# Patient Record
Sex: Male | Born: 1980 | Race: White | Hispanic: No | Marital: Married | Smoking: Current every day smoker
Health system: Southern US, Community
[De-identification: ages and names within clinical notes are randomized; demographics above are authoritative.]

## PROBLEM LIST (undated history)

## (undated) DIAGNOSIS — Z9889 Other specified postprocedural states: Secondary | ICD-10-CM

## (undated) DIAGNOSIS — I4891 Unspecified atrial fibrillation: Secondary | ICD-10-CM

## (undated) DIAGNOSIS — I219 Acute myocardial infarction, unspecified: Secondary | ICD-10-CM

## (undated) HISTORY — PX: LOOP RECORDER REMOVAL: EP1215

## (undated) HISTORY — PX: LOOP RECORDER IMPLANT: SHX5954

---

## 2001-11-03 ENCOUNTER — Emergency Department (HOSPITAL_COMMUNITY): Admission: EM | Admit: 2001-11-03 | Discharge: 2001-11-03 | Payer: Self-pay | Admitting: Emergency Medicine

## 2001-11-03 ENCOUNTER — Encounter: Payer: Self-pay | Admitting: Emergency Medicine

## 2003-02-07 ENCOUNTER — Encounter: Payer: Self-pay | Admitting: Orthopedic Surgery

## 2003-02-07 ENCOUNTER — Ambulatory Visit (HOSPITAL_COMMUNITY): Admission: RE | Admit: 2003-02-07 | Discharge: 2003-02-07 | Payer: Self-pay | Admitting: Orthopedic Surgery

## 2003-03-01 ENCOUNTER — Encounter: Payer: Self-pay | Admitting: Orthopedic Surgery

## 2003-03-01 ENCOUNTER — Ambulatory Visit (HOSPITAL_COMMUNITY): Admission: RE | Admit: 2003-03-01 | Discharge: 2003-03-01 | Payer: Self-pay | Admitting: Orthopedic Surgery

## 2007-05-13 ENCOUNTER — Ambulatory Visit: Payer: Self-pay | Admitting: Psychiatry

## 2007-05-13 ENCOUNTER — Inpatient Hospital Stay (HOSPITAL_COMMUNITY): Admission: AD | Admit: 2007-05-13 | Discharge: 2007-05-16 | Payer: Self-pay | Admitting: Psychiatry

## 2010-11-20 ENCOUNTER — Emergency Department (HOSPITAL_BASED_OUTPATIENT_CLINIC_OR_DEPARTMENT_OTHER)
Admission: EM | Admit: 2010-11-20 | Discharge: 2010-11-20 | Payer: Self-pay | Source: Home / Self Care | Admitting: Emergency Medicine

## 2010-11-24 LAB — URINALYSIS, ROUTINE W REFLEX MICROSCOPIC
Bilirubin Urine: NEGATIVE
Hgb urine dipstick: NEGATIVE
Ketones, ur: NEGATIVE mg/dL
Nitrite: NEGATIVE
Protein, ur: NEGATIVE mg/dL
Specific Gravity, Urine: 1.007 (ref 1.005–1.030)
Urine Glucose, Fasting: NEGATIVE mg/dL
Urobilinogen, UA: 0.2 mg/dL (ref 0.0–1.0)
pH: 5.5 (ref 5.0–8.0)

## 2010-11-24 LAB — DIFFERENTIAL
Basophils Absolute: 0 10*3/uL (ref 0.0–0.1)
Basophils Relative: 1 % (ref 0–1)
Eosinophils Absolute: 0.1 10*3/uL (ref 0.0–0.7)
Eosinophils Relative: 1 % (ref 0–5)
Lymphocytes Relative: 39 % (ref 12–46)
Lymphs Abs: 2.2 10*3/uL (ref 0.7–4.0)
Monocytes Absolute: 0.6 10*3/uL (ref 0.1–1.0)
Monocytes Relative: 10 % (ref 3–12)
Neutro Abs: 2.8 10*3/uL (ref 1.7–7.7)
Neutrophils Relative %: 49 % (ref 43–77)

## 2010-11-24 LAB — BASIC METABOLIC PANEL
BUN: 18 mg/dL (ref 6–23)
CO2: 27 mEq/L (ref 19–32)
Calcium: 9.6 mg/dL (ref 8.4–10.5)
Chloride: 104 mEq/L (ref 96–112)
Creatinine, Ser: 0.8 mg/dL (ref 0.4–1.5)
GFR calc Af Amer: >60 mL/min (ref >60)
GFR calc non Af Amer: >60 mL/min (ref >60)
Glucose, Bld: 101 mg/dL — ABNORMAL HIGH (ref 70–99)
Potassium: 4.5 mEq/L (ref 3.5–5.1)
Sodium: 140 mEq/L (ref 135–145)

## 2010-11-24 LAB — CBC
HCT: 42.9 % (ref 39.0–52.0)
Hemoglobin: 15.3 g/dL (ref 13.0–17.0)
MCH: 29.4 pg (ref 26.0–34.0)
MCHC: 35.7 g/dL (ref 30.0–36.0)
MCV: 82.5 fL (ref 78.0–100.0)
Platelets: 251 10*3/uL (ref 150–400)
RBC: 5.2 MIL/uL (ref 4.22–5.81)
RDW: 12.2 % (ref 11.5–15.5)
WBC: 5.7 10*3/uL (ref 4.0–10.5)

## 2011-02-04 ENCOUNTER — Emergency Department (HOSPITAL_BASED_OUTPATIENT_CLINIC_OR_DEPARTMENT_OTHER)
Admission: EM | Admit: 2011-02-04 | Discharge: 2011-02-04 | Disposition: A | Discharge status: Home / Self Care | Payer: Self-pay | Attending: Emergency Medicine | Admitting: Emergency Medicine

## 2011-02-04 DIAGNOSIS — L03317 Cellulitis of buttock: Secondary | ICD-10-CM | POA: Insufficient documentation

## 2011-02-04 DIAGNOSIS — L0231 Cutaneous abscess of buttock: Secondary | ICD-10-CM | POA: Insufficient documentation

## 2011-02-05 ENCOUNTER — Emergency Department (HOSPITAL_BASED_OUTPATIENT_CLINIC_OR_DEPARTMENT_OTHER)
Admission: EM | Admit: 2011-02-05 | Discharge: 2011-02-05 | Disposition: A | Discharge status: Home / Self Care | Payer: Self-pay | Attending: Emergency Medicine | Admitting: Emergency Medicine

## 2011-02-05 DIAGNOSIS — L0231 Cutaneous abscess of buttock: Secondary | ICD-10-CM | POA: Insufficient documentation

## 2011-02-05 DIAGNOSIS — Z4800 Encounter for change or removal of nonsurgical wound dressing: Secondary | ICD-10-CM | POA: Insufficient documentation

## 2011-02-05 DIAGNOSIS — F17200 Nicotine dependence, unspecified, uncomplicated: Secondary | ICD-10-CM | POA: Insufficient documentation

## 2011-03-20 NOTE — Discharge Summary (Signed)
NAMESTRUMMER, CANIPE                ACCOUNT NO.:  [id number]   MEDICAL RECORD NO.:  [id number]          PATIENT TYPE:  IPS   LOCATION:  0502                          FACILITY:  [facility]   PHYSICIAN:  Jasmine Pang, M.D. DATE OF BIRTH:  1980-12-03   DATE OF ADMISSION:  05/13/2007  DATE OF DISCHARGE:  05/16/2007                               DISCHARGE SUMMARY   IDENTIFICATION:  This is a 30 year old divorced white male who was at  admitted on an involuntary basis on May 13, 2007.   HISTORY OF PRESENT ILLNESS:  The patient states he started drinking  alcohol, he began to drink up to a fifth of vodka, after getting into an  argument over the phone with his ex-wife.  He was upset after receiving  papers charging him with phone harassment.  He states he was just trying  to call is 26-year-old son.  He has visitation with his son every week.  He currently has a court date due to phone harassment.  In addition, his  ex-girlfriend reconciled with her husband.  He has been diagnosed with  bipolar disorder and treated with Seroquel.  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  There are no medical problems.  He is on no medications.  He has no known drug allergies.   PHYSICAL FINDINGS:  The patient was evaluated in the ED prior to  admission.  He had no acute medical or physical problems.   Admission laboratories were done in the ED prior to admission and  evaluated by the ED physician.   HOSPITAL COURSE:  Upon admission, the patient was started on a 21 mg  nicotine patch for smoking cessation protocol.  He was also started on  trazodone 50 mg orally nightly p.r.n., may repeat x1.  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  He was friendly and cooperative.  He discussed stressors  before admission.  He  states he felt his ex-wife was keeping his son  away from him.  He admits to drinking Thursday and then going to  [facility].  They sent him here after he made a threat to hurt  his ex-wife.  As hospitalization progressed, his mood and affect  improved.  A counseling session was held with the counselor and the  patient and his sister about the discharge plan.  The patient's sister  was going to be a good support for the patient.  She will be there if  the patient has an urge to drink or make contact with the patient's ex-  girlfriend.  The patient and sister also came up with a plan B if his  initial plan does not work.  The patient's sister will call the  patient's mother if things get out of hand.  On May 16, 2007, his  mental status had improved.  The patient's mood was euthymic.  Affect,  wide range.  He had no suicidal or homicidal ideation.  No thoughts of  self-injurious behavior.  No auditory or visual hallucinations.  No  paranoia or delusions.  Thoughts were logical and goal-directed.  Thought content:  No predominant theme.  Cognitive was grossly back to  baseline.  It was felt that the patient was safe to be discharged home  with his sister.  He was advised that we would have to honor the duty to  warn law and call his ex-wife since he had made threats against her  which led to his hospitalization.  He was unhappy with this but  understood the necessity of it.  Our case manager did contact his ex-  wife to alert her that these threats had been made.  She was informed  that he was no longer making these threats, but that we wanted to make  her aware of the statements.  She planned to have another restraining  order issued for the patient.   DISCHARGE DIAGNOSES:  AXIS I:  Mood disorder NOS.  Alcohol abuse.  AXIS II:  None.  AXIS III:  None.  AXIS IV:  Severe (Marital and family stress, occupational problem, and  other psychosocial problems.)  AXIS V:  Global  Assessment of Functioning upon discharge was 50.  Global  Assessment of Functioning upon admission was 38.  Global Assessment of  Functioning highest past year was 65.   DISCHARGE/PLAN:  The patient had no specific activity level or dietary  restrictions.   POST HOSPITAL CARE PLANS:  The patient will be seen at the [facility] for follow-up psychiatric treatment.  The patient has no  meds prescribed here.  He was afraid that this would jeopardize his  ability to work at the [employer].      Jasmine Pang, M.D.  Electronically Signed     [facility]/MEDQ  D:  05/26/2007  T:  05/27/2007  Job:  [id number]

## 2012-08-01 ENCOUNTER — Encounter (HOSPITAL_BASED_OUTPATIENT_CLINIC_OR_DEPARTMENT_OTHER): Payer: Self-pay | Admitting: Family Medicine

## 2012-08-01 ENCOUNTER — Emergency Department (HOSPITAL_BASED_OUTPATIENT_CLINIC_OR_DEPARTMENT_OTHER)
Admission: EM | Admit: 2012-08-01 | Discharge: 2012-08-01 | Disposition: A | Discharge status: Home / Self Care | Payer: Worker's Compensation | Attending: Emergency Medicine | Admitting: Emergency Medicine

## 2012-08-01 ENCOUNTER — Emergency Department (HOSPITAL_BASED_OUTPATIENT_CLINIC_OR_DEPARTMENT_OTHER): Payer: Worker's Compensation

## 2012-08-01 DIAGNOSIS — F17200 Nicotine dependence, unspecified, uncomplicated: Secondary | ICD-10-CM | POA: Insufficient documentation

## 2012-08-01 DIAGNOSIS — Y9389 Activity, other specified: Secondary | ICD-10-CM | POA: Insufficient documentation

## 2012-08-01 DIAGNOSIS — M25569 Pain in unspecified knee: Secondary | ICD-10-CM | POA: Insufficient documentation

## 2012-08-01 DIAGNOSIS — Y998 Other external cause status: Secondary | ICD-10-CM | POA: Insufficient documentation

## 2012-08-01 DIAGNOSIS — IMO0002 Reserved for concepts with insufficient information to code with codable children: Secondary | ICD-10-CM | POA: Insufficient documentation

## 2012-08-01 DIAGNOSIS — S93609A Unspecified sprain of unspecified foot, initial encounter: Secondary | ICD-10-CM

## 2012-08-01 DIAGNOSIS — M549 Dorsalgia, unspecified: Secondary | ICD-10-CM

## 2012-08-01 MED ORDER — MELOXICAM 15 MG PO TABS: 15.0000 mg | ORAL_TABLET | Freq: Every day | ORAL | Status: DC

## 2012-08-01 NOTE — ED Notes (Signed)
Pt sts right ankle injury occurred last Thursday out of town and had neg XR and told it was a "bad sprain". Pt sts swelling and pain worse. Pt also c/o low back pain with neg lumbar XR. Pt ambulating on crutches, air splint in place.

## 2012-08-01 NOTE — ED Provider Notes (Signed)
Medical screening examination/treatment/procedure(s) were performed by non-physician practitioner and as supervising physician I was immediately available for consultation/collaboration.   Mahari Vankirk, MD 08/01/12 1728 

## 2012-08-01 NOTE — ED Notes (Signed)
Pt c/o of right ankle pain along with knee and lower back pain from fall on Thursday. Pt states he cannot move his toes and its hurts when trying to move ankle. Able to move knee but complains of pain on the outer aspect of knee.

## 2012-08-01 NOTE — ED Provider Notes (Signed)
History     CSN: [id number]  Arrival date & time 08/01/12  1233   First MD Initiated Contact with Patient 08/01/12 1354      Chief Complaint  Patient presents with  . Ankle Injury    (Consider location/radiation/quality/duration/timing/severity/associated sxs/prior treatment) Patient is a 31 y.o. male presenting with fall. The history is provided by the patient. No language interpreter was used.  Fall Incident onset: 5 days ago. Incident: while lifting heavy furniture. He landed on a hard floor. There was no blood loss. The point of impact was the left knee (left foot). The pain is present in the left knee (left ankle). The pain is at a severity of 6/10. The pain is moderate. He was not ambulatory at the scene. There was no entrapment after the fall. The symptoms are aggravated by standing and ambulation. He has tried ice for the symptoms. The treatment provided no relief.  Pt reports he fell and hit back,  Injured foot and knee.   Pt reports he has back and foot xrays in [place].   Pt reports back is sore but he is continuing to have a lot of pain in his ankle and foot.   History reviewed. No pertinent past medical history.  History reviewed. No pertinent past surgical history.  No family history on file.  History  Substance Use Topics  . Smoking status: Current Every Day Smoker  . Smokeless tobacco: Not on file  . Alcohol Use: No      Review of Systems  Musculoskeletal: Positive for back pain and joint swelling.  All other systems reviewed and are negative.    Allergies  Aspirin  Home Medications  No current outpatient prescriptions on file.  BP 126/80  Pulse 87  Temp 98 F (36.7 C) (Oral)  Resp 16  Ht 6\' 2"  (1.88 m)  Wt 225 lb (102.059 kg)  BMI 28.89 kg/m2  SpO2 99%  Physical Exam  Nursing note and vitals reviewed. Constitutional: He is oriented to person, place, and time. He appears well-developed and well-nourished.  HENT:  Head: Normocephalic.    Cardiovascular: Normal rate.   Pulmonary/Chest: Effort normal.  Musculoskeletal: He exhibits tenderness.       Tender left knee,  No swelling,  Left  Ankle no swelling,  Foot tender no eccymosis.  Neurological: He is alert and oriented to person, place, and time. He has normal reflexes.  Skin: Skin is warm.    ED Course  Procedures (including critical care time)  Labs Reviewed - No data to display No results found.   1. Foot sprain   2. Knee pain   3. Back pain       MDM  Pt given rx for mobic,   I advised follow up with Dr. Shon Baton or your workers comp Physicain for recheck in 3-4 days  Mobic     Lonia Skinner [place], [place] 08/01/12 1518  Lonia Skinner [place], [place] 08/01/12 1521

## 2012-08-23 DIAGNOSIS — S62319A Displaced fracture of base of unspecified metacarpal bone, initial encounter for closed fracture: Secondary | ICD-10-CM | POA: Insufficient documentation

## 2012-08-23 DIAGNOSIS — M79643 Pain in unspecified hand: Secondary | ICD-10-CM | POA: Insufficient documentation

## 2012-08-23 DIAGNOSIS — M25579 Pain in unspecified ankle and joints of unspecified foot: Secondary | ICD-10-CM | POA: Insufficient documentation

## 2012-09-08 DIAGNOSIS — S92213A Displaced fracture of cuboid bone of unspecified foot, initial encounter for closed fracture: Secondary | ICD-10-CM | POA: Insufficient documentation

## 2013-04-19 ENCOUNTER — Emergency Department (HOSPITAL_COMMUNITY)
Admission: EM | Admit: 2013-04-19 | Discharge: 2013-04-19 | Disposition: A | Discharge status: Home / Self Care | Payer: Self-pay | Attending: Emergency Medicine | Admitting: Emergency Medicine

## 2013-04-19 ENCOUNTER — Encounter (HOSPITAL_COMMUNITY): Payer: Self-pay | Admitting: Adult Health

## 2013-04-19 ENCOUNTER — Emergency Department (HOSPITAL_COMMUNITY): Payer: Self-pay

## 2013-04-19 DIAGNOSIS — R109 Unspecified abdominal pain: Secondary | ICD-10-CM | POA: Insufficient documentation

## 2013-04-19 DIAGNOSIS — K921 Melena: Secondary | ICD-10-CM | POA: Insufficient documentation

## 2013-04-19 DIAGNOSIS — Z79899 Other long term (current) drug therapy: Secondary | ICD-10-CM | POA: Insufficient documentation

## 2013-04-19 DIAGNOSIS — R5381 Other malaise: Secondary | ICD-10-CM | POA: Insufficient documentation

## 2013-04-19 HISTORY — DX: Other specified postprocedural states: Z98.890

## 2013-04-19 LAB — COMPREHENSIVE METABOLIC PANEL
ALT: 25 U/L (ref 0–53)
AST: 20 U/L (ref 0–37)
Albumin: 4.3 g/dL (ref 3.5–5.2)
Alkaline Phosphatase: 79 U/L (ref 39–117)
BUN: 14 mg/dL (ref 6–23)
CO2: 27 mEq/L (ref 19–32)
Calcium: 9.8 mg/dL (ref 8.4–10.5)
Chloride: 101 mEq/L (ref 96–112)
Creatinine, Ser: 0.9 mg/dL (ref 0.50–1.35)
GFR calc Af Amer: >90 mL/min (ref >90)
GFR calc non Af Amer: >90 mL/min (ref >90)
Glucose, Bld: 100 mg/dL — ABNORMAL HIGH (ref 70–99)
Potassium: 4.4 mEq/L (ref 3.5–5.1)
Sodium: 139 mEq/L (ref 135–145)
Total Bilirubin: 0.3 mg/dL (ref 0.3–1.2)
Total Protein: 7.2 g/dL (ref 6.0–8.3)

## 2013-04-19 LAB — URINALYSIS, ROUTINE W REFLEX MICROSCOPIC
Bilirubin Urine: NEGATIVE
Glucose, UA: NEGATIVE mg/dL
Hgb urine dipstick: NEGATIVE
Ketones, ur: NEGATIVE mg/dL
Leukocytes, UA: NEGATIVE
Nitrite: NEGATIVE
Protein, ur: NEGATIVE mg/dL
Specific Gravity, Urine: 1.017 (ref 1.005–1.030)
Urobilinogen, UA: 1 mg/dL (ref 0.0–1.0)
pH: 6.5 (ref 5.0–8.0)

## 2013-04-19 LAB — CBC
HCT: 44.8 % (ref 39.0–52.0)
Hemoglobin: 16.4 g/dL (ref 13.0–17.0)
MCH: 30.8 pg (ref 26.0–34.0)
MCHC: 36.6 g/dL — ABNORMAL HIGH (ref 30.0–36.0)
MCV: 84.2 fL (ref 78.0–100.0)
Platelets: 281 10*3/uL (ref 150–400)
RBC: 5.32 MIL/uL (ref 4.22–5.81)
RDW: 12.4 % (ref 11.5–15.5)
WBC: 7.8 10*3/uL (ref 4.0–10.5)

## 2013-04-19 LAB — POCT I-STAT TROPONIN I: Troponin i, poc: 0.03 ng/mL (ref 0.00–0.08)

## 2013-04-19 LAB — LIPASE, BLOOD: Lipase: 29 U/L (ref 11–59)

## 2013-04-19 MED ORDER — SODIUM CHLORIDE 0.9 % IV BOLUS (SEPSIS)
500.0000 mL | Freq: Once | INTRAVENOUS | Status: AC
  Administered 2013-04-19: 500 mL via INTRAVENOUS

## 2013-04-19 MED ORDER — IOHEXOL 300 MG/ML  SOLN
100.0000 mL | Freq: Once | INTRAMUSCULAR | Status: AC | PRN
  Administered 2013-04-19: 100 mL via INTRAVENOUS

## 2013-04-19 MED ORDER — ONDANSETRON HCL 4 MG/2ML IJ SOLN
4.0000 mg | Freq: Once | INTRAMUSCULAR | Status: AC
  Administered 2013-04-19: 4 mg via INTRAVENOUS
  Filled 2013-04-19: qty 2

## 2013-04-19 MED ORDER — IOHEXOL 300 MG/ML  SOLN
25.0000 mL | INTRAMUSCULAR | Status: DC
  Administered 2013-04-19: 25 mL via ORAL

## 2013-04-19 MED ORDER — ONDANSETRON 8 MG PO TBDP: 8.0000 mg | ORAL_TABLET | Freq: Three times a day (TID) | ORAL | Status: DC | PRN

## 2013-04-19 MED ORDER — ONDANSETRON 4 MG PO TBDP
8.0000 mg | ORAL_TABLET | Freq: Once | ORAL | Status: AC
  Administered 2013-04-19: 8 mg via ORAL
  Filled 2013-04-19: qty 2

## 2013-04-19 NOTE — ED Provider Notes (Signed)
History     CSN: [id number]  Arrival date & time 04/19/13  1650   First MD Initiated Contact with Patient 04/19/13 1857      Chief Complaint  Patient presents with  . Chest Pain    (Consider location/radiation/quality/duration/timing/severity/associated sxs/prior treatment) Patient is a 32 y.o. male presenting with chest pain.  Chest Pain Associated symptoms: fatigue   Associated symptoms: no abdominal pain, no back pain, no headache, no nausea, no numbness, no shortness of breath, not vomiting and no weakness    patient has had chest and abdominal pain over the last few weeks. His been seen [facility] at [facility]   he has had a head CT. He reported had a syncopal episode a few weeks ago. He states he has had upper abdominal pain also. He states he does be with chest pain and abdominal pain. He states abdominal pain is on the right side and goes down into his groin. He states that his stool has been white and chalky recently. He states his energy level has been down also. He has not gained or lost weight. He states he also has had some blood in the stool times. The chest pain will come and go it is dull and retrosternal. Abdominal pain also, go it is dull. He states he feels better his legs are drawn up to his abdomen. No dysuria. Past Medical History  Diagnosis Date  . H/O eye surgery     History reviewed. No pertinent past surgical history.  History reviewed. No pertinent family history.  History  Substance Use Topics  . Smoking status: Current Every Day Smoker  . Smokeless tobacco: Not on file  . Alcohol Use: No      Review of Systems  Constitutional: Positive for fatigue. Negative for activity change and appetite change.  HENT: Negative for neck stiffness.   Eyes: Negative for pain.  Respiratory: Negative for chest tightness and shortness of breath.   Cardiovascular: Positive for chest pain. Negative for leg swelling.  Gastrointestinal: Positive for blood in stool.  Negative for nausea, vomiting, abdominal pain and diarrhea.  Genitourinary: Negative for flank pain.  Musculoskeletal: Negative for back pain.  Skin: Negative for rash.  Neurological: Negative for weakness, numbness and headaches.  Psychiatric/Behavioral: Negative for behavioral problems.    Allergies  Aspirin  Home Medications   Current Outpatient Rx  Name  Route  Sig  Dispense  Refill  . meloxicam (MOBIC) 15 MG tablet   Oral   Take 1 tablet (15 mg total) by mouth daily.   20 tablet   0   . oxyCODONE-acetaminophen (PERCOCET/ROXICET) 5-325 MG per tablet   Oral   Take 1 tablet by mouth every 4 (four) hours as needed for pain.         . promethazine (PHENERGAN) 25 MG tablet   Oral   Take 25 mg by mouth every 6 (six) hours as needed for nausea.         . ondansetron (ZOFRAN-ODT) 8 MG disintegrating tablet   Oral   Take 1 tablet (8 mg total) by mouth every 8 (eight) hours as needed for nausea.   10 tablet   0     BP 108/73  Pulse 53  Temp(Src) 97.3 F (36.3 C) (Oral)  Resp 20  SpO2 99%  Physical Exam  Nursing note and vitals reviewed. Constitutional: He is oriented to person, place, and time. He appears well-developed and well-nourished.  HENT:  Head: Normocephalic and atraumatic.  Eyes: EOM  are normal. Pupils are equal, round, and reactive to light.  Neck: Normal range of motion. Neck supple.  Cardiovascular: Normal rate, regular rhythm and normal heart sounds.   No murmur heard. Pulmonary/Chest: Effort normal and breath sounds normal.  Abdominal: Soft. Bowel sounds are normal. He exhibits no distension and no mass. There is tenderness. There is no rebound and no guarding.  Right-sided mid to lower abdominal tenderness. Also right upper quadrant tenderness. No hernias palpated. Mild right testicular tenderness. Normal lie.  Genitourinary: Penis normal.  Musculoskeletal: Normal range of motion. He exhibits no edema.  Neurological: He is alert and oriented to  person, place, and time. No cranial nerve deficit.  Skin: Skin is warm and dry.  Psychiatric: He has a normal mood and affect.    ED Course  Procedures (including critical care time)  Labs Reviewed  CBC - Abnormal; Notable for the following:    MCHC 36.6 (*)    All other components within normal limits  COMPREHENSIVE METABOLIC PANEL - Abnormal; Notable for the following:    Glucose, Bld 100 (*)    All other components within normal limits  LIPASE, BLOOD  URINALYSIS, ROUTINE W REFLEX MICROSCOPIC  POCT I-STAT TROPONIN I   Dg Chest 2 View  04/19/2013   *RADIOLOGY REPORT*  Clinical Data: 32 year old male chest pain shortness of breath and vomiting.  CHEST - 2 VIEW  Comparison: [facility] 04/17/2013 and earlier.  Findings: Lung volumes remain within normal limits. Normal cardiac size and mediastinal contours.  Visualized tracheal air column is within normal limits.  The lungs remain clear.  No pneumothorax or pneumoperitoneum. Visualized bowel gas pattern is nonobstructed. No acute osseous abnormality identified.  IMPRESSION: Negative, no acute cardiopulmonary abnormality.   Original Report Authenticated By: Erskine Speed, M.D.   Ct Abdomen Pelvis W Contrast  04/19/2013   *RADIOLOGY REPORT*  Clinical Data: Abdominal pain.  CT ABDOMEN AND PELVIS WITH CONTRAST  Technique:  Multidetector CT imaging of the abdomen and pelvis was performed following the standard protocol during bolus administration of intravenous contrast.  Contrast: OMNIPAQUE IOHEXOL 300 MG/ML  SOLN  Comparison: 11/20/2010  Findings: Lung bases are clear.  No effusions.  Heart is normal size.  Liver, stomach, spleen, pancreas, adrenals and kidneys are normal. Gallbladder is contracted.  Appendix is visualized and is normal. Bowel grossly unremarkable. No free fluid, free air, or adenopathy.  Urinary bladder and prostate grossly unremarkable.  Calcified phleboliths in the anatomic pelvis.  No acute bony abnormality.   IMPRESSION: No acute findings in the abdomen or pelvis.   Original Report Authenticated By: Charlett Nose, M.D.     1. Abdominal pain      Date: 04/20/2013  Rate: 83  Rhythm: normal sinus rhythm  QRS Axis: normal  Intervals: normal  ST/T Wave abnormalities: normal  Conduction Disutrbances: none  Narrative Interpretation: unremarkable       MDM  Patient just abdominal pain. Has had some weight loss. His been worked at [employer]. Doubt cardiac cause. Abdominal CT was done reassuring. Patient be discharged home. GI follow up        Juliet Rude. Rubin Payor, MD 04/20/13 1952

## 2013-04-19 NOTE — ED Notes (Signed)
Presents with sternal chest pain and bilateral upper quadrant pain described as sharp and intermittent since Friday, pain on right side upper quadrant is worse pan and described as sharp and constant. Denies SOB, pain is associated with nausea and vomiting. Bending over in fetal position makes pain better. Nothing makes pain worse.

## 2013-10-04 ENCOUNTER — Emergency Department (HOSPITAL_BASED_OUTPATIENT_CLINIC_OR_DEPARTMENT_OTHER)
Admission: EM | Admit: 2013-10-04 | Discharge: 2013-10-04 | Disposition: A | Discharge status: Home / Self Care | Payer: Self-pay | Attending: Emergency Medicine | Admitting: Emergency Medicine

## 2013-10-04 ENCOUNTER — Encounter (HOSPITAL_BASED_OUTPATIENT_CLINIC_OR_DEPARTMENT_OTHER): Payer: Self-pay | Admitting: Emergency Medicine

## 2013-10-04 DIAGNOSIS — K5900 Constipation, unspecified: Secondary | ICD-10-CM | POA: Insufficient documentation

## 2013-10-04 DIAGNOSIS — R1110 Vomiting, unspecified: Secondary | ICD-10-CM | POA: Insufficient documentation

## 2013-10-04 DIAGNOSIS — Z9889 Other specified postprocedural states: Secondary | ICD-10-CM | POA: Insufficient documentation

## 2013-10-04 DIAGNOSIS — N419 Inflammatory disease of prostate, unspecified: Secondary | ICD-10-CM | POA: Insufficient documentation

## 2013-10-04 DIAGNOSIS — Z791 Long term (current) use of non-steroidal anti-inflammatories (NSAID): Secondary | ICD-10-CM | POA: Insufficient documentation

## 2013-10-04 DIAGNOSIS — R000 Tachycardia, unspecified: Secondary | ICD-10-CM | POA: Insufficient documentation

## 2013-10-04 DIAGNOSIS — F17200 Nicotine dependence, unspecified, uncomplicated: Secondary | ICD-10-CM | POA: Insufficient documentation

## 2013-10-04 LAB — URINALYSIS, ROUTINE W REFLEX MICROSCOPIC
Bilirubin Urine: NEGATIVE
Glucose, UA: NEGATIVE mg/dL
Ketones, ur: NEGATIVE mg/dL
Nitrite: NEGATIVE
Protein, ur: 100 mg/dL — AB
Specific Gravity, Urine: 1.02 (ref 1.005–1.030)
Urobilinogen, UA: 1 mg/dL (ref 0.0–1.0)
pH: 6 (ref 5.0–8.0)

## 2013-10-04 LAB — URINE MICROSCOPIC-ADD ON

## 2013-10-04 MED ORDER — AZITHROMYCIN 250 MG PO TABS
1000.0000 mg | ORAL_TABLET | Freq: Once | ORAL | Status: AC
  Administered 2013-10-04: 1000 mg via ORAL
  Filled 2013-10-04: qty 4

## 2013-10-04 MED ORDER — CEFTRIAXONE SODIUM 250 MG IJ SOLR
250.0000 mg | Freq: Once | INTRAMUSCULAR | Status: AC
  Administered 2013-10-04: 250 mg via INTRAMUSCULAR
  Filled 2013-10-04: qty 250

## 2013-10-04 MED ORDER — DIPHENHYDRAMINE HCL 25 MG PO CAPS
25.0000 mg | ORAL_CAPSULE | Freq: Once | ORAL | Status: AC
  Administered 2013-10-04: 25 mg via ORAL
  Filled 2013-10-04: qty 1

## 2013-10-04 MED ORDER — OXYCODONE-ACETAMINOPHEN 5-325 MG PO TABS: 1.0000 | ORAL_TABLET | Freq: Four times a day (QID) | ORAL | Status: DC | PRN

## 2013-10-04 MED ORDER — CIPROFLOXACIN HCL 500 MG PO TABS: 500.0000 mg | ORAL_TABLET | Freq: Two times a day (BID) | ORAL | Status: DC

## 2013-10-04 MED ORDER — CIPROFLOXACIN HCL 500 MG PO TABS
500.0000 mg | ORAL_TABLET | Freq: Once | ORAL | Status: AC
  Administered 2013-10-04: 500 mg via ORAL
  Filled 2013-10-04: qty 1

## 2013-10-04 MED ORDER — OXYCODONE-ACETAMINOPHEN 5-325 MG PO TABS
1.0000 | ORAL_TABLET | Freq: Once | ORAL | Status: AC
  Administered 2013-10-04: 1 via ORAL
  Filled 2013-10-04: qty 1

## 2013-10-04 NOTE — ED Provider Notes (Signed)
CSN: [id number]     Arrival date & time 10/04/13  1845 History  This chart was scribed for Gwyneth Sprout, MD by Danella Maiers, ED Scribe. This patient was seen in room MH10/MH10 and the patient's care was started at 7:03 PM.   Chief Complaint  Patient presents with  . Dysuria   The history is provided by the patient. No language interpreter was used.   HPI Comments: Marcus Morris is a 32 y.o. male who presents to the Emergency Department complaining of dysuria, decreased urine output, testicular pain and swelling bilaterally, and suprapubic abdominal pain onset one week ago. He reports he has been constipated since 3 days ago, last BM was 3 days ago. He reports 4 episodes emesis last night and one episode 3 days ago. He is not on any medications currently. He is allergic to aspirin. He is sexually active with one partner. He does not use protection.   Past Medical History  Diagnosis Date  . H/O eye surgery    History reviewed. No pertinent past surgical history. History reviewed. No pertinent family history. History  Substance Use Topics  . Smoking status: Current Every Day Smoker -- 0.50 packs/day    Types: Cigarettes  . Smokeless tobacco: Not on file  . Alcohol Use: No    Review of Systems  Gastrointestinal: Positive for vomiting, abdominal pain and constipation.  Genitourinary: Positive for dysuria, decreased urine volume, scrotal swelling and testicular pain.  A complete 10 system review of systems was obtained and all systems are negative except as noted in the HPI and PMH.    Allergies  Aspirin  Home Medications   Current Outpatient Rx  Name  Route  Sig  Dispense  Refill  . meloxicam (MOBIC) 15 MG tablet   Oral   Take 1 tablet (15 mg total) by mouth daily.   20 tablet   0   . ondansetron (ZOFRAN-ODT) 8 MG disintegrating tablet   Oral   Take 1 tablet (8 mg total) by mouth every 8 (eight) hours as needed for nausea.   10 tablet   0   . oxyCODONE-acetaminophen  (PERCOCET/ROXICET) 5-325 MG per tablet   Oral   Take 1 tablet by mouth every 4 (four) hours as needed for pain.         . promethazine (PHENERGAN) 25 MG tablet   Oral   Take 25 mg by mouth every 6 (six) hours as needed for nausea.          BP 126/86  Pulse 105  Temp(Src) 98.1 F (36.7 C) (Oral)  Resp 16  Ht 6\' 2"  (1.88 m)  Wt 225 lb (102.059 kg)  BMI 28.88 kg/m2  SpO2 100% Physical Exam  Nursing note and vitals reviewed. Constitutional: He is oriented to person, place, and time. He appears well-developed and well-nourished. No distress.  HENT:  Head: Normocephalic and atraumatic.  Eyes: EOM are normal.  Neck: Neck supple. No tracheal deviation present.  Cardiovascular: Normal rate.   tachycardic  Pulmonary/Chest: Effort normal. No respiratory distress.  Clear and equal breath sounds bilaterally  Abdominal:  suprapubic abdominal pain  Genitourinary:  No notable testicular swelling or focal areas or pain but his prostate is enlarged boggy and tender to palpation  Musculoskeletal: Normal range of motion.  Neurological: He is alert and oriented to person, place, and time.  Skin: Skin is warm and dry.  Psychiatric: He has a normal mood and affect. His behavior is normal.    ED Course  Procedures (including critical care time) Medications - No data to display  DIAGNOSTIC STUDIES: Oxygen Saturation is 100% on RA, normal by my interpretation.    COORDINATION OF CARE: 7:12 PM- Discussed treatment plan with pt which includes UA and pain medication. Pt agrees to plan.    Labs Review Labs Reviewed  URINALYSIS, ROUTINE W REFLEX MICROSCOPIC - Abnormal; Notable for the following:    APPearance CLOUDY (*)    Hgb urine dipstick TRACE (*)    Protein, ur 100 (*)    Leukocytes, UA LARGE (*)    All other components within normal limits  URINE MICROSCOPIC-ADD ON - Abnormal; Notable for the following:    Bacteria, UA FEW (*)    All other components within normal limits   URINE CULTURE   Imaging Review No results found.  EKG Interpretation   None       MDM   1. Prostatitis     Patient here with symptoms most suggestive of prostatitis. His prostate is is swollen, boggy and tender. He has no signs of testicular pathology is sexually active with only one partner and denies any penile discharge. He has tenderness with urination and suprapubic tenderness as well. He's had intermittent vomiting but no fever. After urinating and bedside ultrasound was done that showed no urinary retention. UA consistent with UTI. Will treat patient with Cipro to cover for prostatitis    I personally performed the services described in this documentation, which was scribed in my presence.  The recorded information has been reviewed and considered.    Gwyneth Sprout, MD 10/04/13 [phone]

## 2013-10-04 NOTE — ED Notes (Signed)
Pt c/o painful urination x 1 week  

## 2013-10-04 NOTE — ED Notes (Addendum)
Pt with multiple ?s r/t penile d/c-EDP [facility] back in with pt per pt request-orders received

## 2013-10-04 NOTE — ED Notes (Signed)
Pt states his girlfriend is in ED-to her tx room to await shot time

## 2013-10-05 LAB — GC/CHLAMYDIA PROBE AMP
CT Probe RNA: POSITIVE — AB
GC Probe RNA: POSITIVE — AB

## 2013-10-06 ENCOUNTER — Telehealth (HOSPITAL_COMMUNITY): Payer: Self-pay | Admitting: Emergency Medicine

## 2013-10-06 LAB — URINE CULTURE
Colony Count: NO GROWTH
Culture: NO GROWTH

## 2013-10-06 NOTE — ED Notes (Signed)
Patient informed of positive results after id'd x 2 and informed of need to notify partner to be treated. 

## 2013-10-06 NOTE — ED Notes (Signed)
+  Gonorrhea. +Chlamydia. Patient treated with Rocephin and Zithromax. [employer] faxed. 

## 2013-10-06 NOTE — ED Notes (Signed)
Patient has +Gonorrhea and +Chlamydia. °

## 2015-04-10 DIAGNOSIS — F329 Major depressive disorder, single episode, unspecified: Secondary | ICD-10-CM | POA: Insufficient documentation

## 2015-04-10 DIAGNOSIS — F32A Depression, unspecified: Secondary | ICD-10-CM | POA: Insufficient documentation

## 2015-04-11 DIAGNOSIS — T1491XA Suicide attempt, initial encounter: Secondary | ICD-10-CM | POA: Insufficient documentation

## 2015-04-11 DIAGNOSIS — R45851 Suicidal ideations: Secondary | ICD-10-CM | POA: Insufficient documentation

## 2015-04-11 DIAGNOSIS — G8929 Other chronic pain: Secondary | ICD-10-CM | POA: Insufficient documentation

## 2015-04-11 DIAGNOSIS — I10 Essential (primary) hypertension: Secondary | ICD-10-CM | POA: Insufficient documentation

## 2015-04-11 DIAGNOSIS — R0789 Other chest pain: Secondary | ICD-10-CM | POA: Insufficient documentation

## 2015-04-12 DIAGNOSIS — R200 Anesthesia of skin: Secondary | ICD-10-CM | POA: Insufficient documentation

## 2015-05-08 DIAGNOSIS — R55 Syncope and collapse: Secondary | ICD-10-CM | POA: Insufficient documentation

## 2015-05-08 DIAGNOSIS — R000 Tachycardia, unspecified: Secondary | ICD-10-CM | POA: Insufficient documentation

## 2015-07-02 ENCOUNTER — Emergency Department (HOSPITAL_COMMUNITY): Payer: Self-pay

## 2015-07-02 ENCOUNTER — Encounter (HOSPITAL_COMMUNITY): Payer: Self-pay | Admitting: Emergency Medicine

## 2015-07-02 ENCOUNTER — Emergency Department (HOSPITAL_COMMUNITY)
Admission: EM | Admit: 2015-07-02 | Discharge: 2015-07-02 | Disposition: A | Discharge status: Home / Self Care | Payer: Self-pay | Attending: Emergency Medicine | Admitting: Emergency Medicine

## 2015-07-02 DIAGNOSIS — I252 Old myocardial infarction: Secondary | ICD-10-CM | POA: Insufficient documentation

## 2015-07-02 DIAGNOSIS — Y9389 Activity, other specified: Secondary | ICD-10-CM | POA: Insufficient documentation

## 2015-07-02 DIAGNOSIS — Y998 Other external cause status: Secondary | ICD-10-CM | POA: Insufficient documentation

## 2015-07-02 DIAGNOSIS — Y92410 Unspecified street and highway as the place of occurrence of the external cause: Secondary | ICD-10-CM | POA: Insufficient documentation

## 2015-07-02 DIAGNOSIS — M25552 Pain in left hip: Secondary | ICD-10-CM

## 2015-07-02 DIAGNOSIS — Z79899 Other long term (current) drug therapy: Secondary | ICD-10-CM | POA: Insufficient documentation

## 2015-07-02 DIAGNOSIS — S79912A Unspecified injury of left hip, initial encounter: Secondary | ICD-10-CM | POA: Insufficient documentation

## 2015-07-02 DIAGNOSIS — Z720 Tobacco use: Secondary | ICD-10-CM | POA: Insufficient documentation

## 2015-07-02 DIAGNOSIS — S3992XA Unspecified injury of lower back, initial encounter: Secondary | ICD-10-CM | POA: Insufficient documentation

## 2015-07-02 HISTORY — DX: Acute myocardial infarction, unspecified: I21.9

## 2015-07-02 HISTORY — DX: Unspecified atrial fibrillation: I48.91

## 2015-07-02 LAB — COMPREHENSIVE METABOLIC PANEL
ALT: 24 U/L (ref 17–63)
AST: 29 U/L (ref 15–41)
Albumin: 4 g/dL (ref 3.5–5.0)
Alkaline Phosphatase: 78 U/L (ref 38–126)
Anion gap: 6 (ref 5–15)
BUN: 16 mg/dL (ref 6–20)
CO2: 24 mmol/L (ref 22–32)
Calcium: 9.1 mg/dL (ref 8.9–10.3)
Chloride: 107 mmol/L (ref 101–111)
Creatinine, Ser: 1 mg/dL (ref 0.61–1.24)
GFR calc Af Amer: >60 mL/min (ref >60)
GFR calc non Af Amer: >60 mL/min (ref >60)
Glucose, Bld: 99 mg/dL (ref 65–99)
Potassium: 4.2 mmol/L (ref 3.5–5.1)
Sodium: 137 mmol/L (ref 135–145)
Total Bilirubin: 0.8 mg/dL (ref 0.3–1.2)
Total Protein: 6.3 g/dL — ABNORMAL LOW (ref 6.5–8.1)

## 2015-07-02 LAB — CBC
HCT: 41.3 % (ref 39.0–52.0)
Hemoglobin: 14.2 g/dL (ref 13.0–17.0)
MCH: 30.1 pg (ref 26.0–34.0)
MCHC: 34.4 g/dL (ref 30.0–36.0)
MCV: 87.5 fL (ref 78.0–100.0)
Platelets: 238 10*3/uL (ref 150–400)
RBC: 4.72 MIL/uL (ref 4.22–5.81)
RDW: 12.5 % (ref 11.5–15.5)
WBC: 8.1 10*3/uL (ref 4.0–10.5)

## 2015-07-02 LAB — ETHANOL: Alcohol, Ethyl (B): <5 mg/dL (ref <5)

## 2015-07-02 LAB — APTT: aPTT: 29 seconds (ref 24–37)

## 2015-07-02 MED ORDER — OXYCODONE-ACETAMINOPHEN 5-325 MG PO TABS: 1.0000 | ORAL_TABLET | Freq: Three times a day (TID) | ORAL | Status: DC | PRN

## 2015-07-02 MED ORDER — HYDROMORPHONE HCL 1 MG/ML IJ SOLN
1.0000 mg | Freq: Once | INTRAMUSCULAR | Status: AC
  Administered 2015-07-02: 1 mg via INTRAVENOUS
  Filled 2015-07-02: qty 1
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
## 2015-07-02 NOTE — ED Notes (Signed)
Pt given crutches and instruction on use and acknowledged instructions. Pt. Given blue paper scrubs for discharge due to clothes being cut.

## 2015-07-03 ENCOUNTER — Encounter (HOSPITAL_COMMUNITY): Payer: Self-pay | Admitting: Emergency Medicine

## 2015-07-05 ENCOUNTER — Telehealth: Payer: Self-pay | Admitting: General Practice

## 2015-07-05 NOTE — Telephone Encounter (Signed)
error 

## 2016-01-16 ENCOUNTER — Emergency Department (HOSPITAL_BASED_OUTPATIENT_CLINIC_OR_DEPARTMENT_OTHER)
Admission: EM | Admit: 2016-01-16 | Discharge: 2016-01-16 | Disposition: A | Discharge status: Home / Self Care | Payer: Self-pay | Attending: Emergency Medicine | Admitting: Emergency Medicine

## 2016-01-16 ENCOUNTER — Encounter (HOSPITAL_BASED_OUTPATIENT_CLINIC_OR_DEPARTMENT_OTHER): Payer: Self-pay | Admitting: Emergency Medicine

## 2016-01-16 ENCOUNTER — Emergency Department (HOSPITAL_BASED_OUTPATIENT_CLINIC_OR_DEPARTMENT_OTHER): Payer: No Typology Code available for payment source

## 2016-01-16 DIAGNOSIS — Z79899 Other long term (current) drug therapy: Secondary | ICD-10-CM | POA: Insufficient documentation

## 2016-01-16 DIAGNOSIS — Z792 Long term (current) use of antibiotics: Secondary | ICD-10-CM | POA: Insufficient documentation

## 2016-01-16 DIAGNOSIS — Z791 Long term (current) use of non-steroidal anti-inflammatories (NSAID): Secondary | ICD-10-CM | POA: Insufficient documentation

## 2016-01-16 DIAGNOSIS — I4891 Unspecified atrial fibrillation: Secondary | ICD-10-CM | POA: Insufficient documentation

## 2016-01-16 DIAGNOSIS — R0789 Other chest pain: Secondary | ICD-10-CM | POA: Insufficient documentation

## 2016-01-16 DIAGNOSIS — F17210 Nicotine dependence, cigarettes, uncomplicated: Secondary | ICD-10-CM | POA: Insufficient documentation

## 2016-01-16 DIAGNOSIS — I252 Old myocardial infarction: Secondary | ICD-10-CM | POA: Insufficient documentation

## 2016-01-16 LAB — COMPREHENSIVE METABOLIC PANEL
ALT: 36 U/L (ref 17–63)
AST: 35 U/L (ref 15–41)
Albumin: 4.7 g/dL (ref 3.5–5.0)
Alkaline Phosphatase: 81 U/L (ref 38–126)
Anion gap: 12 (ref 5–15)
BUN: 17 mg/dL (ref 6–20)
CO2: 23 mmol/L (ref 22–32)
Calcium: 9.2 mg/dL (ref 8.9–10.3)
Chloride: 101 mmol/L (ref 101–111)
Creatinine, Ser: 1 mg/dL (ref 0.61–1.24)
GFR calc Af Amer: >60 mL/min (ref >60)
GFR calc non Af Amer: >60 mL/min (ref >60)
Glucose, Bld: 105 mg/dL — ABNORMAL HIGH (ref 65–99)
Potassium: 4.2 mmol/L (ref 3.5–5.1)
Sodium: 136 mmol/L (ref 135–145)
Total Bilirubin: 1 mg/dL (ref 0.3–1.2)
Total Protein: 7.9 g/dL (ref 6.5–8.1)

## 2016-01-16 LAB — RAPID URINE DRUG SCREEN, HOSP PERFORMED
Amphetamines: NOT DETECTED
Barbiturates: NOT DETECTED
Benzodiazepines: NOT DETECTED
Cocaine: NOT DETECTED
Opiates: NOT DETECTED
Tetrahydrocannabinol: NOT DETECTED

## 2016-01-16 LAB — TROPONIN I: Troponin I: <0.03 ng/mL (ref <0.031)

## 2016-01-16 LAB — CBC WITH DIFFERENTIAL/PLATELET
Basophils Absolute: 0 10*3/uL (ref 0.0–0.1)
Basophils Relative: 0 %
Eosinophils Absolute: 0.1 10*3/uL (ref 0.0–0.7)
Eosinophils Relative: 1 %
HCT: 43.6 % (ref 39.0–52.0)
Hemoglobin: 15.3 g/dL (ref 13.0–17.0)
Lymphocytes Relative: 40 %
Lymphs Abs: 3.2 10*3/uL (ref 0.7–4.0)
MCH: 29.7 pg (ref 26.0–34.0)
MCHC: 35.1 g/dL (ref 30.0–36.0)
MCV: 84.7 fL (ref 78.0–100.0)
Monocytes Absolute: 0.8 10*3/uL (ref 0.1–1.0)
Monocytes Relative: 10 %
Neutro Abs: 3.9 10*3/uL (ref 1.7–7.7)
Neutrophils Relative %: 49 %
Platelets: 270 10*3/uL (ref 150–400)
RBC: 5.15 MIL/uL (ref 4.22–5.81)
RDW: 12.6 % (ref 11.5–15.5)
WBC: 8 10*3/uL (ref 4.0–10.5)

## 2016-01-16 LAB — D-DIMER, QUANTITATIVE: D-Dimer, Quant: 0.31 ug/mL-FEU (ref 0.00–0.50)

## 2016-01-16 MED ORDER — ACETAMINOPHEN 500 MG PO TABS
1000.0000 mg | ORAL_TABLET | Freq: Once | ORAL | Status: AC
  Administered 2016-01-16: 1000 mg via ORAL
  Filled 2016-01-16: qty 2

## 2016-01-16 MED ORDER — SODIUM CHLORIDE 0.9 % IV BOLUS (SEPSIS)
1000.0000 mL | Freq: Once | INTRAVENOUS | Status: AC
  Administered 2016-01-16: 1000 mL via INTRAVENOUS

## 2016-01-16 MED ORDER — GI COCKTAIL ~~LOC~~
30.0000 mL | Freq: Once | ORAL | Status: AC
  Administered 2016-01-16: 30 mL via ORAL
  Filled 2016-01-16: qty 30

## 2016-01-16 MED ORDER — ONDANSETRON HCL 4 MG/2ML IJ SOLN
4.0000 mg | Freq: Once | INTRAMUSCULAR | Status: AC
  Administered 2016-01-16: 4 mg via INTRAVENOUS
  Filled 2016-01-16: qty 2

## 2016-01-16 MED ORDER — METHOCARBAMOL 500 MG PO TABS
1000.0000 mg | ORAL_TABLET | Freq: Once | ORAL | Status: AC
  Administered 2016-01-16: 1000 mg via ORAL
  Filled 2016-01-16: qty 2

## 2016-01-16 MED ORDER — LIDOCAINE 5 % EX PTCH: 1.0000 | MEDICATED_PATCH | CUTANEOUS | Status: DC

## 2016-01-16 MED ORDER — METHOCARBAMOL 500 MG PO TABS: 500.0000 mg | ORAL_TABLET | Freq: Two times a day (BID) | ORAL | Status: DC

## 2016-01-16 NOTE — ED Provider Notes (Addendum)
CSN: [id number]     Arrival date & time 01/16/16  0430 History   First MD Initiated Contact with Patient 01/16/16 [phone]     Chief Complaint  Patient presents with  . Chest Pain     (Consider location/radiation/quality/duration/timing/severity/associated sxs/prior Treatment) Patient is a 35 y.o. male presenting with chest pain.  Chest Pain Pain location:  L chest (at site of loop recorder) Pain quality: sharp   Pain radiates to:  Does not radiate Pain radiates to the back: no   Pain severity:  Moderate Onset quality:  Gradual Duration:  1 day Timing:  Constant Progression:  Waxing and waning Chronicity:  Recurrent Context: not eating and not lifting   Relieved by:  Nothing Worsened by:  Nothing tried Ineffective treatments:  None tried Associated symptoms: no abdominal pain, no dizziness, no fatigue, no fever, no lower extremity edema, no palpitations, no shortness of breath and not vomiting   Risk factors: male sex   Risk factors: no aortic disease   Reviewed outside records from [place], [place] and [place].  There is no history of an abnormal EKG or abnormal troponin nor do any of these institutions list ASCVD nor ACS as a diagnosis.  He has had a loop recorder since ~2004.  Most recently seen at [facility] for CP following suicide attempt and was admitted to medicine and cleared from a cariac standpoint and sent to psychiatry  Past Medical History  Diagnosis Date  . H/O eye surgery   . Atrial fibrillation (HCC)   . MI (myocardial infarction) [facility])    Past Surgical History  Procedure Laterality Date  . Loop recorder implant     History reviewed. No pertinent family history. Social History  Substance Use Topics  . Smoking status: Current Every Day Smoker -- 0.50 packs/day    Types: Cigarettes  . Smokeless tobacco: None  . Alcohol Use: No    Review of Systems  Constitutional: Negative for fever and fatigue.  Respiratory: Negative for chest tightness and shortness of breath.    Cardiovascular: Positive for chest pain. Negative for palpitations and leg swelling.  Gastrointestinal: Negative for vomiting and abdominal pain.  Neurological: Negative for dizziness.  All other systems reviewed and are negative.     Allergies  Asa; Aspirin; and Shellfish allergy  Home Medications   Prior to Admission medications   Medication Sig Start Date End Date Taking? Authorizing Provider  ciprofloxacin (CIPRO) 500 MG tablet Take 1 tablet (500 mg total) by mouth 2 (two) times daily. 10/04/13   Gwyneth Sprout, MD  citalopram (CELEXA) 20 MG tablet Take 20 mg by mouth daily.    Historical Provider, MD  meloxicam (MOBIC) 15 MG tablet Take 1 tablet (15 mg total) by mouth daily. 08/01/12   Elson Areas, PA-C  metoprolol tartrate (LOPRESSOR) 25 MG tablet Take 25-50 mg by mouth 2 (two) times daily. 25 mg in the morning and 50 in the evening    Historical Provider, MD  Multiple Vitamins-Minerals (CENTRUM MULTIGUMMIES PO) Take 1 tablet by mouth daily.    Historical Provider, MD  ondansetron (ZOFRAN-ODT) 8 MG disintegrating tablet Take 1 tablet (8 mg total) by mouth every 8 (eight) hours as needed for nausea. 04/19/13   Benjiman Core, MD  oxyCODONE-acetaminophen (PERCOCET/ROXICET) 5-325 MG per tablet Take 1 tablet by mouth every 4 (four) hours as needed for pain.    Historical Provider, MD  oxyCODONE-acetaminophen (PERCOCET/ROXICET) 5-325 MG per tablet Take 1-2 tablets by mouth every 6 (six) hours as needed for  severe pain. 10/04/13   Gwyneth SproutWhitney Plunkett, MD  oxyCODONE-acetaminophen (PERCOCET/ROXICET) 5-325 MG per tablet Take 1 tablet by mouth every 8 (eight) hours as needed for severe pain. 07/02/15   Angelina Okyan Franasiak, MD  promethazine (PHENERGAN) 25 MG tablet Take 25 mg by mouth every 6 (six) hours as needed for nausea.    Historical Provider, MD   BP 127/80 mmHg  Pulse 66  Temp(Src) 97.6 F (36.4 C)  Resp 21  Wt 240 lb (108.863 kg)  SpO2 99% Physical Exam  Constitutional: He is  oriented to person, place, and time. He appears well-developed and well-nourished. No distress.  HENT:  Head: Normocephalic and atraumatic.  Mouth/Throat: Oropharynx is clear and moist.  Eyes: Conjunctivae are normal. Pupils are equal, round, and reactive to light.  Neck: Normal range of motion. Neck supple.  Cardiovascular: Normal rate, regular rhythm and intact distal pulses.   Pulmonary/Chest: Effort normal and breath sounds normal. No respiratory distress. He has no wheezes. He has no rales.  Abdominal: Soft. Bowel sounds are normal. There is no tenderness. There is no rebound and no guarding.  Musculoskeletal: Normal range of motion. He exhibits no edema or tenderness.  Neurological: He is alert and oriented to person, place, and time.  Skin: Skin is warm and dry. He is not diaphoretic.  Psychiatric: He has a normal mood and affect.    ED Course  Procedures (including critical care time) Labs Review Labs Reviewed  COMPREHENSIVE METABOLIC PANEL - Abnormal; Notable for the following:    Glucose, Bld 105 (*)    All other components within normal limits  CBC WITH DIFFERENTIAL/PLATELET  TROPONIN I  D-DIMER, QUANTITATIVE (NOT AT [facility])  URINE RAPID DRUG SCREEN, HOSP PERFORMED    Imaging Review Dg Chest Port 1 View  01/16/2016  CLINICAL DATA:  Pain and vomiting. EXAM: PORTABLE CHEST 1 VIEW COMPARISON:  None. FINDINGS: Normal heart size and mediastinal contours. Loop recorder noted. No acute infiltrate or edema. No effusion or pneumothorax. No acute osseous findings. IMPRESSION: Negative portable chest. Electronically Signed   By: Marnee SpringJonathon  Watts M.D.   On: 01/16/2016 05:06   I have personally reviewed and evaluated these images and lab results as part of my medical decision-making.   EKG Interpretation   Date/Time:  Thursday January 16 2016 04:36:45 EDT Ventricular Rate:  77 PR Interval:  121 QRS Duration: 81 QT Interval:  366 QTC Calculation: 414 R Axis:   34 Text  Interpretation:  Sinus rhythm Confirmed by [facility]  MD, Fifi Schindler  ([id number]) on 01/16/2016 4:44:08 AM      MDM   Final diagnoses:  None   On a detail review of outside records all cardiac causes of CP and syncope have been excluded.  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     The patient is stating he has 8/10 pain but sitting in the room calmly testing on the phone.  Given his ASA allergy I will not prescribe NSAIDs and given his psychiatric history I do not feel opioids are in the patient's best interest.    PERC negative wells 0, highly doubt PE in this low risk patient with negative Ddimer.  Given length of symptoms with normal EKG and troponin ACS is excluded.  Patient is instructed to follow up with his cardiologist for interrogation of his loop recorder.  After careful review of OSH records I  do not believe this patient has had an acute coronary event in the past.  Symptoms are highly atypical for cardiac chest pain.  I suspect this is anxiety related.  Follow up with your cardiologist.  Strict chest pain return precautions given.     Cy Blamer, MD 01/16/16 [id number]  Cy Blamer, MD 01/16/16 [phone]

## 2016-01-16 NOTE — Discharge Instructions (Signed)
Chest Wall Pain °Chest wall pain is pain in or around the bones and muscles of your chest. Sometimes, an injury causes this pain. Sometimes, the cause may not be known. This pain may take several weeks or longer to get better. °HOME CARE °Pay attention to any changes in your symptoms. Take these actions to help with your pain: °· Rest as told by your doctor. °· Avoid activities that cause pain. Try not to use your chest, belly (abdominal), or side muscles to lift heavy things. °· If directed, apply ice to the painful area: °¨ Put ice in a plastic bag. °¨ Place a towel between your skin and the bag. °¨ Leave the ice on for 20 minutes, 2-3 times per day. °· Take over-the-counter and prescription medicines only as told by your doctor. °· Do not use tobacco products, including cigarettes, chewing tobacco, and e-cigarettes. If you need help quitting, ask your doctor. °· Keep all follow-up visits as told by your doctor. This is important. °GET HELP IF: °· You have a fever. °· Your chest pain gets worse. °· You have new symptoms. °GET HELP RIGHT AWAY IF: °· You feel sick to your stomach (nauseous) or you throw up (vomit). °· You feel sweaty or light-headed. °· You have a cough with phlegm (sputum) or you cough up blood. °· You are short of breath. °  °This information is not intended to replace advice given to you by your health care provider. Make sure you discuss any questions you have with your health care provider. °  °Document Released: 04/06/2008 Document Revised: 07/10/2015 Document Reviewed: 01/14/2015 °Elsevier Interactive Patient Education ©2016 [employer] ° °
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
## 2016-01-16 NOTE — ED Notes (Signed)
Pt given d/c instructions as per chart. Verbalizes understanding. No questions. 

## 2016-01-16 NOTE — ED Notes (Signed)
MD at bedside to discuss results.

## 2016-01-25 ENCOUNTER — Encounter (HOSPITAL_BASED_OUTPATIENT_CLINIC_OR_DEPARTMENT_OTHER): Payer: Self-pay | Admitting: *Deleted

## 2016-01-25 ENCOUNTER — Emergency Department (HOSPITAL_BASED_OUTPATIENT_CLINIC_OR_DEPARTMENT_OTHER)
Admission: EM | Admit: 2016-01-25 | Discharge: 2016-01-25 | Disposition: A | Discharge status: Home / Self Care | Payer: Self-pay | Attending: Emergency Medicine | Admitting: Emergency Medicine

## 2016-01-25 ENCOUNTER — Emergency Department (HOSPITAL_BASED_OUTPATIENT_CLINIC_OR_DEPARTMENT_OTHER): Payer: Self-pay

## 2016-01-25 DIAGNOSIS — Y990 Civilian activity done for income or pay: Secondary | ICD-10-CM | POA: Insufficient documentation

## 2016-01-25 DIAGNOSIS — Y9389 Activity, other specified: Secondary | ICD-10-CM | POA: Insufficient documentation

## 2016-01-25 DIAGNOSIS — R937 Abnormal findings on diagnostic imaging of other parts of musculoskeletal system: Secondary | ICD-10-CM | POA: Insufficient documentation

## 2016-01-25 DIAGNOSIS — Z79899 Other long term (current) drug therapy: Secondary | ICD-10-CM | POA: Insufficient documentation

## 2016-01-25 DIAGNOSIS — Y9289 Other specified places as the place of occurrence of the external cause: Secondary | ICD-10-CM | POA: Insufficient documentation

## 2016-01-25 DIAGNOSIS — S93401A Sprain of unspecified ligament of right ankle, initial encounter: Secondary | ICD-10-CM | POA: Insufficient documentation

## 2016-01-25 DIAGNOSIS — X58XXXA Exposure to other specified factors, initial encounter: Secondary | ICD-10-CM | POA: Insufficient documentation

## 2016-01-25 DIAGNOSIS — I252 Old myocardial infarction: Secondary | ICD-10-CM | POA: Insufficient documentation

## 2016-01-25 DIAGNOSIS — F17210 Nicotine dependence, cigarettes, uncomplicated: Secondary | ICD-10-CM | POA: Insufficient documentation

## 2016-01-25 DIAGNOSIS — I4891 Unspecified atrial fibrillation: Secondary | ICD-10-CM | POA: Insufficient documentation

## 2016-01-25 MED ORDER — OXYCODONE-ACETAMINOPHEN 5-325 MG PO TABS
1.0000 | ORAL_TABLET | Freq: Four times a day (QID) | ORAL | Status: DC | PRN
Start: 2016-01-25 — End: 2020-09-06

## 2016-01-25 MED ORDER — CITALOPRAM HYDROBROMIDE 20 MG PO TABS: 20.0000 mg | ORAL_TABLET | Freq: Every day | ORAL | Status: DC

## 2016-01-25 MED ORDER — METOPROLOL TARTRATE 25 MG PO TABS: ORAL_TABLET | ORAL | Status: DC

## 2016-01-25 NOTE — Discharge Instructions (Signed)
Ankle Sprain  An ankle sprain is an injury to the strong, fibrous tissues (ligaments) that hold the bones of your ankle joint together.   CAUSES  An ankle sprain is usually caused by a fall or by twisting your ankle. Ankle sprains most commonly occur when you step on the outer edge of your foot, and your ankle turns inward. People who participate in sports are more prone to these types of injuries.   SYMPTOMS    Pain in your ankle. The pain may be present at rest or only when you are trying to stand or walk.   Swelling.   Bruising. Bruising may develop immediately or within 1 to 2 days after your injury.   Difficulty standing or walking, particularly when turning corners or changing directions.  DIAGNOSIS   Your caregiver will ask you details about your injury and perform a physical exam of your ankle to determine if you have an ankle sprain. During the physical exam, your caregiver will press on and apply pressure to specific areas of your foot and ankle. Your caregiver will try to move your ankle in certain ways. An X-ray exam may be done to be sure a bone was not broken or a ligament did not separate from one of the bones in your ankle (avulsion fracture).   TREATMENT   Certain types of braces can help stabilize your ankle. Your caregiver can make a recommendation for this. Your caregiver may recommend the use of medicine for pain. If your sprain is severe, your caregiver may refer you to a surgeon who helps to restore function to parts of your skeletal system (orthopedist) or a physical therapist.  HOME CARE INSTRUCTIONS    Apply ice to your injury for 1-2 days or as directed by your caregiver. Applying ice helps to reduce inflammation and pain.    Put ice in a plastic bag.    Place a towel between your skin and the bag.    Leave the ice on for 15-20 minutes at a time, every 2 hours while you are awake.   Only take over-the-counter or prescription medicines for pain, discomfort, or fever as directed by  your caregiver.   Elevate your injured ankle above the level of your heart as much as possible for 2-3 days.   If your caregiver recommends crutches, use them as instructed. Gradually put weight on the affected ankle. Continue to use crutches or a cane until you can walk without feeling pain in your ankle.   If you have a plaster splint, wear the splint as directed by your caregiver. Do not rest it on anything harder than a pillow for the first 24 hours. Do not put weight on it. Do not get it wet. You may take it off to take a shower or bath.   You may have been given an elastic bandage to wear around your ankle to provide support. If the elastic bandage is too tight (you have numbness or tingling in your foot or your foot becomes cold and blue), adjust the bandage to make it comfortable.   If you have an air splint, you may blow more air into it or let air out to make it more comfortable. You may take your splint off at night and before taking a shower or bath. Wiggle your toes in the splint several times per day to decrease swelling.  SEEK MEDICAL CARE IF:    You have rapidly increasing bruising or swelling.   Your toes feel   extremely cold or you lose feeling in your foot.   Your pain is not relieved with medicine.  SEEK IMMEDIATE MEDICAL CARE IF:   Your toes are numb or blue.   You have severe pain that is increasing.  MAKE SURE YOU:    Understand these instructions.   Will watch your condition.   Will get help right away if you are not doing well or get worse.     This information is not intended to replace advice given to you by your health care provider. Make sure you discuss any questions you have with your health care provider.     Document Released: 10/19/2005 Document Revised: 11/09/2014 Document Reviewed: 10/31/2011  [employer]vier Interactive Patient Education 2016 [employer]
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
## 2016-01-25 NOTE — ED Notes (Signed)
Pt tolerated well. Pt was not happy about the crutches and stated that he may not use them at home. PMS is intact before and after.
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
## 2016-02-05 DIAGNOSIS — R569 Unspecified convulsions: Secondary | ICD-10-CM | POA: Insufficient documentation

## 2016-02-05 DIAGNOSIS — R4189 Other symptoms and signs involving cognitive functions and awareness: Secondary | ICD-10-CM | POA: Insufficient documentation

## 2016-02-05 DIAGNOSIS — S20219A Contusion of unspecified front wall of thorax, initial encounter: Secondary | ICD-10-CM | POA: Insufficient documentation

## 2017-07-03 ENCOUNTER — Emergency Department (HOSPITAL_BASED_OUTPATIENT_CLINIC_OR_DEPARTMENT_OTHER): Payer: Self-pay

## 2017-07-03 ENCOUNTER — Encounter (HOSPITAL_BASED_OUTPATIENT_CLINIC_OR_DEPARTMENT_OTHER): Payer: Self-pay | Admitting: Emergency Medicine

## 2017-07-03 ENCOUNTER — Emergency Department (HOSPITAL_BASED_OUTPATIENT_CLINIC_OR_DEPARTMENT_OTHER)
Admission: EM | Admit: 2017-07-03 | Discharge: 2017-07-03 | Disposition: A | Discharge status: Home / Self Care | Payer: Self-pay | Attending: Emergency Medicine | Admitting: Emergency Medicine

## 2017-07-03 DIAGNOSIS — F17210 Nicotine dependence, cigarettes, uncomplicated: Secondary | ICD-10-CM | POA: Insufficient documentation

## 2017-07-03 DIAGNOSIS — I252 Old myocardial infarction: Secondary | ICD-10-CM | POA: Insufficient documentation

## 2017-07-03 DIAGNOSIS — W228XXA Striking against or struck by other objects, initial encounter: Secondary | ICD-10-CM | POA: Insufficient documentation

## 2017-07-03 DIAGNOSIS — Y990 Civilian activity done for income or pay: Secondary | ICD-10-CM | POA: Insufficient documentation

## 2017-07-03 DIAGNOSIS — S92415A Nondisplaced fracture of proximal phalanx of left great toe, initial encounter for closed fracture: Secondary | ICD-10-CM | POA: Insufficient documentation

## 2017-07-03 DIAGNOSIS — Z79899 Other long term (current) drug therapy: Secondary | ICD-10-CM | POA: Insufficient documentation

## 2017-07-03 DIAGNOSIS — Y939 Activity, unspecified: Secondary | ICD-10-CM | POA: Insufficient documentation

## 2017-07-03 DIAGNOSIS — Y929 Unspecified place or not applicable: Secondary | ICD-10-CM | POA: Insufficient documentation

## 2017-07-03 DIAGNOSIS — S92405A Nondisplaced unspecified fracture of left great toe, initial encounter for closed fracture: Secondary | ICD-10-CM

## 2017-07-03 MED ORDER — HYDROCODONE-ACETAMINOPHEN 5-325 MG PO TABS: 1.0000 | ORAL_TABLET | Freq: Four times a day (QID) | ORAL | 0 refills | Status: DC | PRN

## 2017-07-03 MED ORDER — IBUPROFEN 200 MG PO TABS
600.0000 mg | ORAL_TABLET | Freq: Once | ORAL | Status: AC
  Administered 2017-07-03: 600 mg via ORAL
  Filled 2017-07-03: qty 1
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
## 2017-07-03 NOTE — Discharge Instructions (Signed)
You can take Tylenol or Ibuprofen as directed for pain. You can take pain medication as directed for breakthrough pain.  Wear the post-op shoe for support and stabilization.  Follow-up with referred orthopedic doctor in the next 2-3 days.  Return the emergency permit for dorsi pain, re numbness of the foot, difficulty breathing for any other worsening or concerning symptoms.dness/swelling of the foot, fever,

## 2017-09-26 IMAGING — CR DG FOOT COMPLETE 3+V*L*
3 series · 3 of 3 positions shown · non-contrast
Comparison: None.

CLINICAL DATA: Status post fall today with left foot pain.

EXAM:
LEFT FOOT - COMPLETE 3+ VIEW

[t foot ap left]
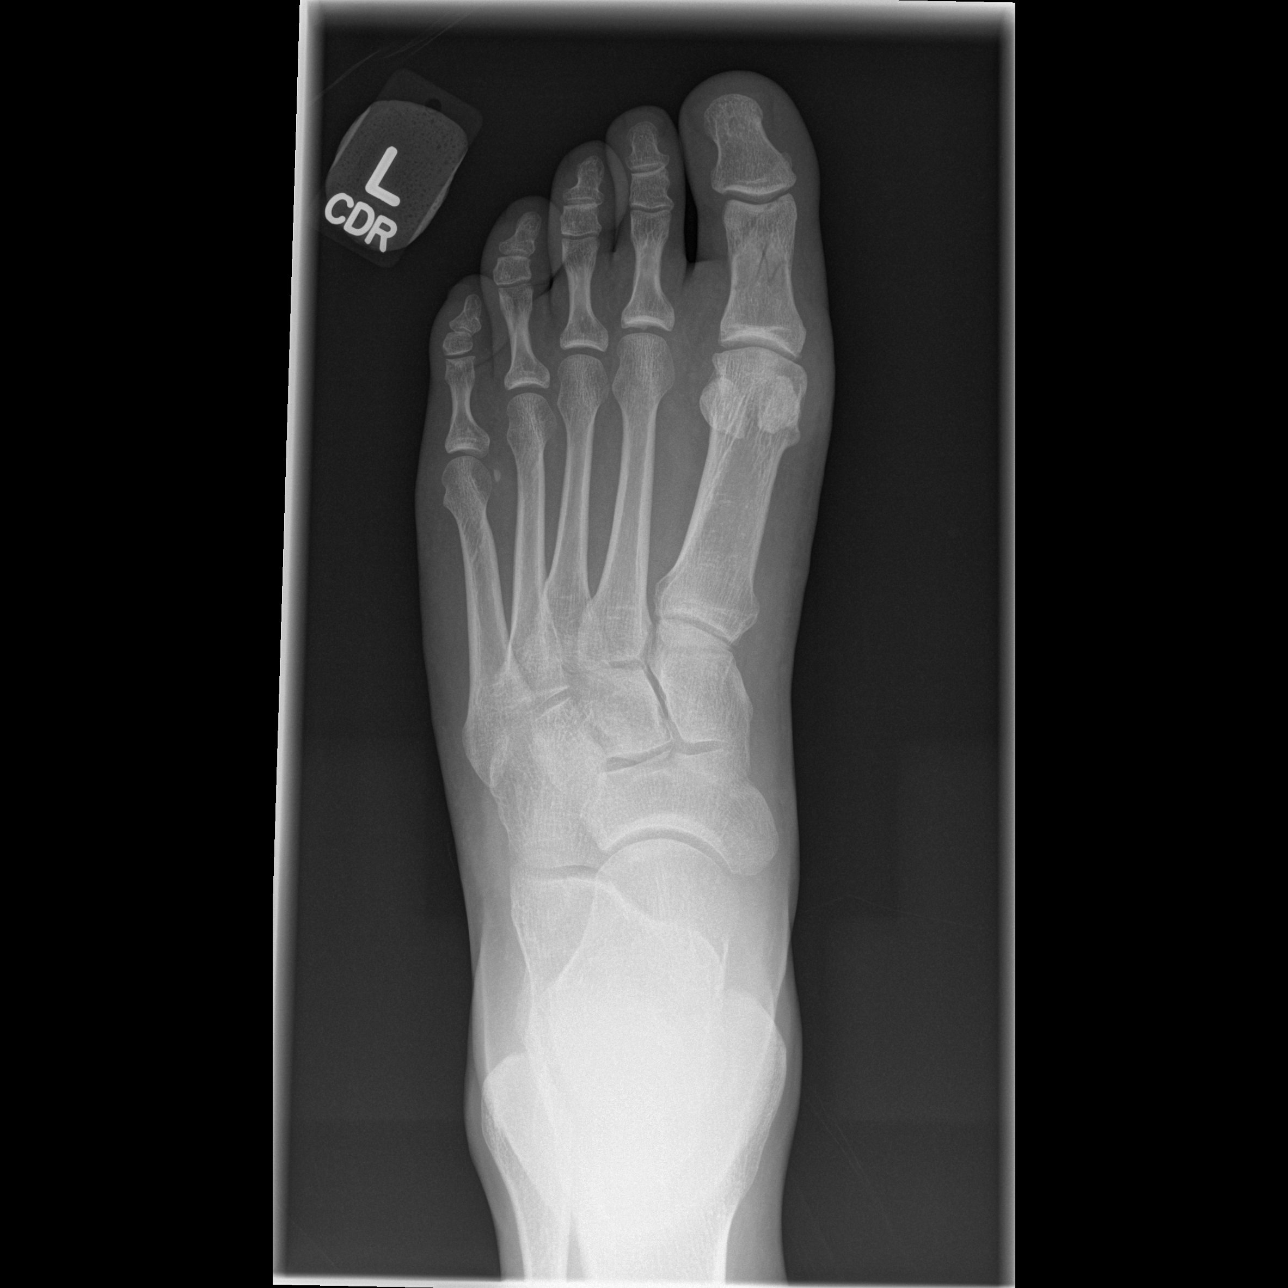

[t foot oblique left]
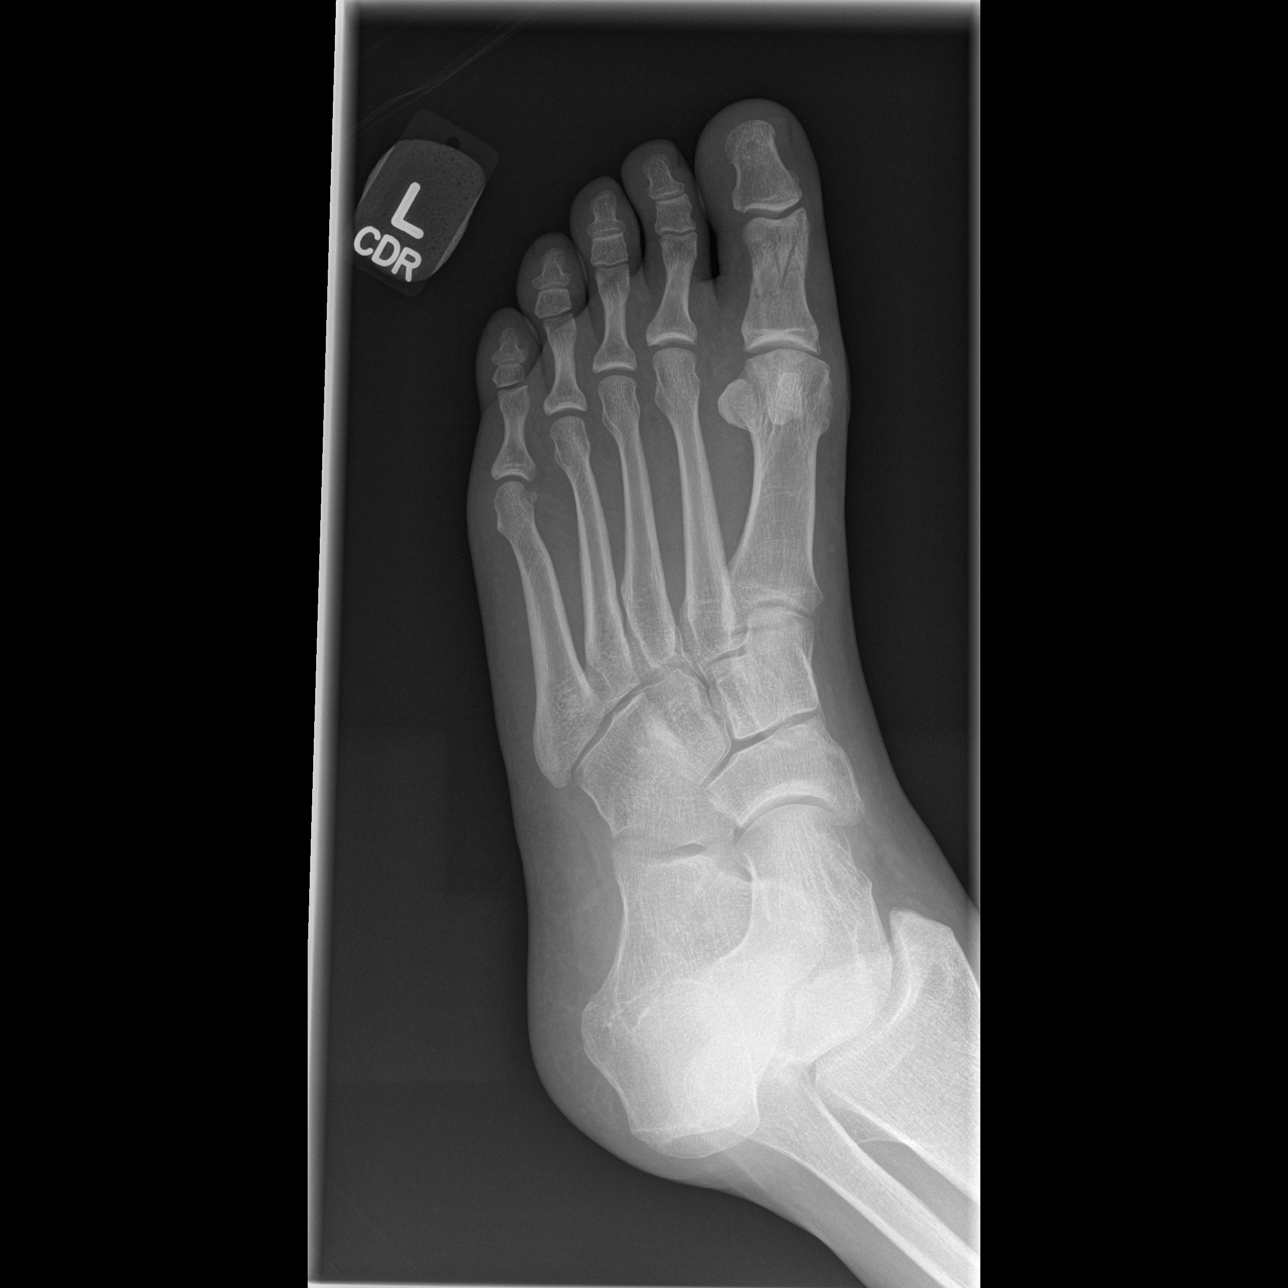

[t foot lat left]
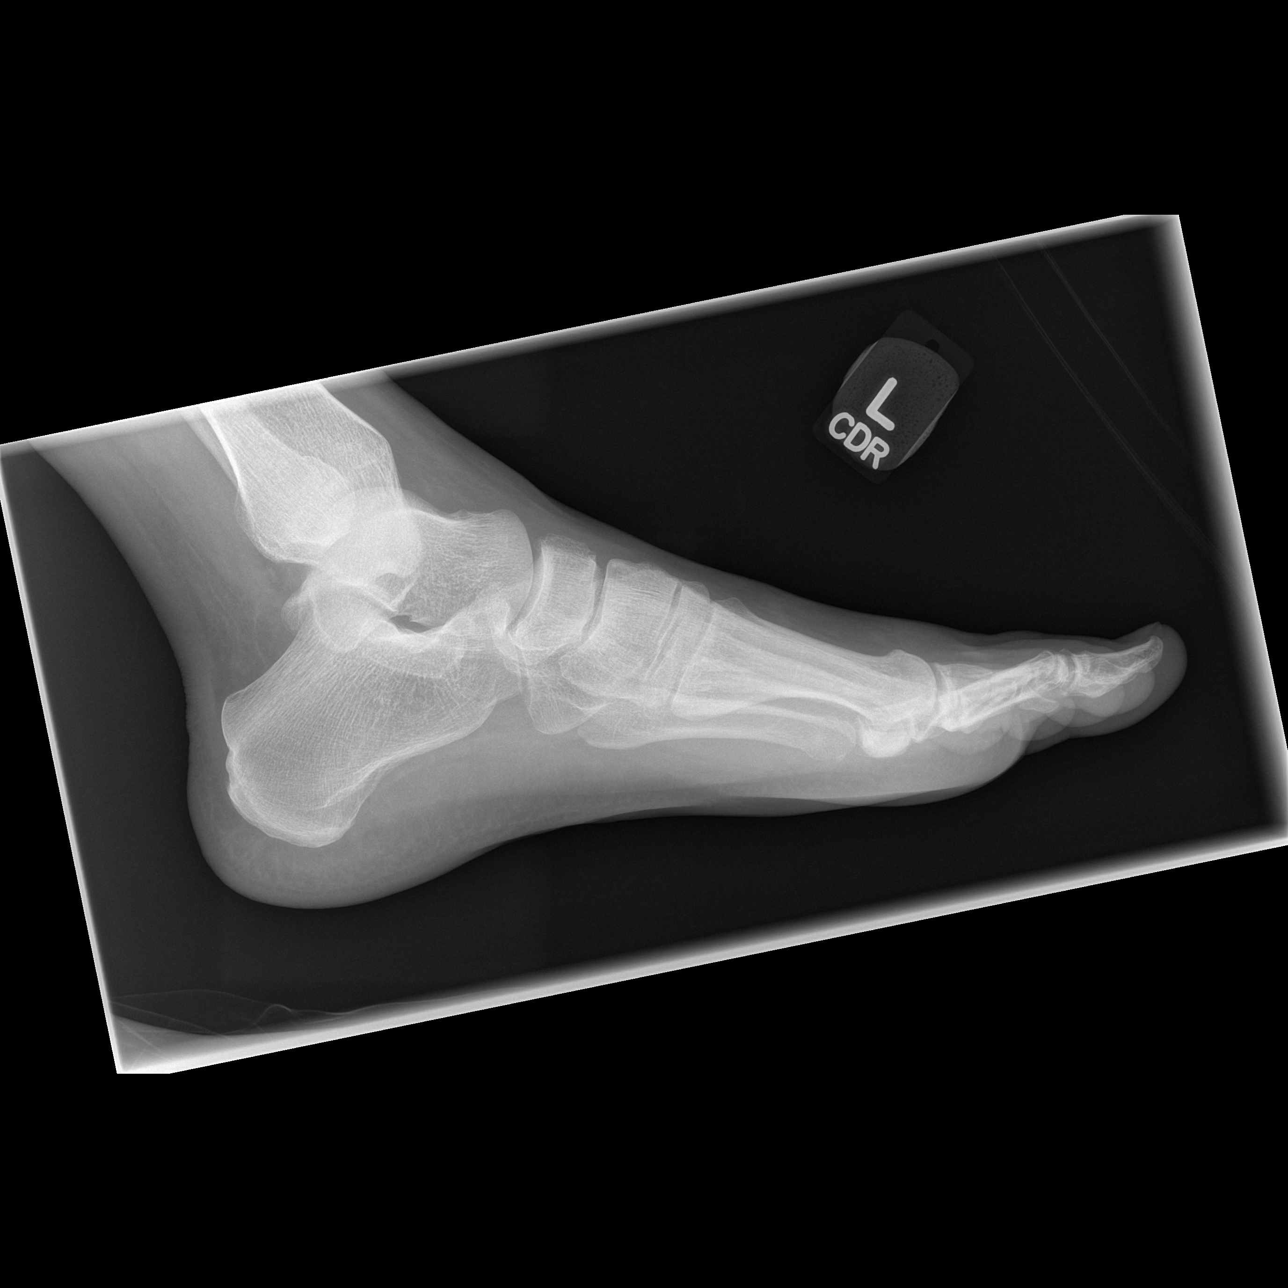

[3 of 3 positions shown; findings below may reference images not displayed]

FINDINGS: There is comminuted minimal displaced intra-articular fracture of
the left first proximal phalanx. There is no dislocation.
IMPRESSION: Fracture of the left first proximal phalanx.

## 2019-08-25 DIAGNOSIS — Z4509 Encounter for adjustment and management of other cardiac device: Secondary | ICD-10-CM | POA: Insufficient documentation

## 2019-08-25 DIAGNOSIS — F17200 Nicotine dependence, unspecified, uncomplicated: Secondary | ICD-10-CM | POA: Insufficient documentation

## 2019-08-25 DIAGNOSIS — E663 Overweight: Secondary | ICD-10-CM | POA: Insufficient documentation

## 2019-08-25 DIAGNOSIS — Z95818 Presence of other cardiac implants and grafts: Secondary | ICD-10-CM | POA: Insufficient documentation

## 2019-08-25 DIAGNOSIS — Z87898 Personal history of other specified conditions: Secondary | ICD-10-CM | POA: Insufficient documentation

## 2020-03-20 ENCOUNTER — Other Ambulatory Visit: Payer: Self-pay

## 2020-03-20 ENCOUNTER — Encounter (HOSPITAL_BASED_OUTPATIENT_CLINIC_OR_DEPARTMENT_OTHER): Payer: Self-pay

## 2020-03-20 ENCOUNTER — Emergency Department (HOSPITAL_BASED_OUTPATIENT_CLINIC_OR_DEPARTMENT_OTHER)
Admission: EM | Admit: 2020-03-20 | Discharge: 2020-03-20 | Disposition: A | Discharge status: Home / Self Care | Payer: Commercial Managed Care - PPO | Attending: Emergency Medicine | Admitting: Emergency Medicine

## 2020-03-20 ENCOUNTER — Emergency Department (HOSPITAL_BASED_OUTPATIENT_CLINIC_OR_DEPARTMENT_OTHER): Payer: Commercial Managed Care - PPO

## 2020-03-20 DIAGNOSIS — F17210 Nicotine dependence, cigarettes, uncomplicated: Secondary | ICD-10-CM | POA: Diagnosis not present

## 2020-03-20 DIAGNOSIS — N433 Hydrocele, unspecified: Secondary | ICD-10-CM | POA: Insufficient documentation

## 2020-03-20 DIAGNOSIS — H5711 Ocular pain, right eye: Secondary | ICD-10-CM | POA: Insufficient documentation

## 2020-03-20 DIAGNOSIS — H5789 Other specified disorders of eye and adnexa: Secondary | ICD-10-CM

## 2020-03-20 MED ORDER — FLUORESCEIN SODIUM 1 MG OP STRP
1.0000 | ORAL_STRIP | Freq: Once | OPHTHALMIC | Status: DC
  Filled 2020-03-20: qty 1

## 2020-03-20 MED ORDER — TETRACAINE HCL 0.5 % OP SOLN
2.0000 [drp] | Freq: Once | OPHTHALMIC | Status: DC
  Filled 2020-03-20: qty 4

## 2020-03-20 NOTE — ED Provider Notes (Signed)
[facility] EMERGENCY DEPARTMENT Provider Note   CSN: [id number] Arrival date & time: 03/20/20  1235     History Chief Complaint  Patient presents with  . Multiple c/o    Marcus Morris is a 39 y.o. male.  He is here with 3 complaints.  He has had about 1 month of right eye irritation.  He says it feels like there is dirt in his father and it is light sensitive.  No known trauma.  Does not wear glasses or contacts.  Had tried some drops given to him by [facility] medical without improvement.  Also 4 days ago awoke with blood coming out of his right ear.  Uses Q-tips in the canal.  No change in hearing.  Has not occurred since.  Also complaining of left testicular pain and a lump that is been there for about 7 months.  No known trauma.  No urinary symptoms no blood in his urine or sperm.   The history is provided by the patient.  Eye Problem Location:  Right eye Quality:  Foreign body sensation Severity:  Moderate Onset quality:  Gradual Duration:  1 month Timing:  Constant Progression:  Unchanged Chronicity:  New Relieved by:  Nothing Worsened by:  Bright light and eye movement Ineffective treatments: some drops. Associated symptoms: itching, photophobia and redness   Associated symptoms: no blurred vision, no decreased vision, no discharge, no double vision, no facial rash, no headaches, no nausea, no scotomas and no vomiting        Past Medical History:  Diagnosis Date  . Atrial fibrillation (HCC)   . H/O eye surgery   . MI (myocardial infarction) (HCC)     There are no problems to display for this patient.   Past Surgical History:  Procedure Laterality Date  . LOOP RECORDER IMPLANT    . LOOP RECORDER REMOVAL         No family history on file.  Social History   Tobacco Use  . Smoking status: Current Every Day Smoker    Packs/day: 0.50    Types: Cigarettes  . Smokeless tobacco: Former Engineer, water Use Topics  . Alcohol use: No    Comment:  occ  . Drug use: No    Home Medications Prior to Admission medications   Medication Sig Start Date End Date Taking? Authorizing Provider  citalopram (CELEXA) 20 MG tablet Take 1 tablet (20 mg total) by mouth daily. 01/25/16   Molpus, John, MD  HYDROcodone-acetaminophen (NORCO/VICODIN) 5-325 MG tablet Take 1-2 tablets by mouth every 6 (six) hours as needed. 07/03/17   Maxwell Caul, PA-C  metoprolol tartrate (LOPRESSOR) 25 MG tablet Take 25mg  (one tablet) in the morning and 50mg  (2 tablets) in the evening 01/25/16   Molpus, John, MD  Multiple Vitamins-Minerals (CENTRUM MULTIGUMMIES PO) Take 1 tablet by mouth daily.    [provider]  oxyCODONE-acetaminophen (PERCOCET/ROXICET) 5-325 MG tablet Take 1-2 tablets by mouth every 6 (six) hours as needed (for pain). 01/25/16   Molpus, John, MD  promethazine (PHENERGAN) 25 MG tablet Take 25 mg by mouth every 6 (six) hours as needed for nausea.    [provider]    Allergies    Asa [aspirin], Aspirin, and Shellfish allergy  Review of Systems   Review of Systems  Constitutional: Negative for fever.  HENT: Positive for ear discharge. Negative for sore throat.   Eyes: Positive for photophobia, redness and itching. Negative for blurred vision, double vision and discharge.  Respiratory:  Negative for shortness of breath.   Cardiovascular: Negative for chest pain.  Gastrointestinal: Negative for abdominal pain, nausea and vomiting.  Genitourinary: Positive for testicular pain. Negative for dysuria.  Musculoskeletal: Negative for neck pain.  Skin: Negative for rash.  Neurological: Negative for headaches.    Physical Exam Updated Vital Signs BP (!) 141/91   Pulse (!) 52   Temp 97.9 F (36.6 C) (Oral)   Resp 17   Ht 6' (1.829 m)   Wt 102.5 kg   SpO2 100%   BMI 30.65 kg/m   Physical Exam Vitals and nursing note reviewed.  Constitutional:      Appearance: He is well-developed.  HENT:     Head: Normocephalic and  atraumatic.  Eyes:     General: Lids are everted, no foreign bodies appreciated. Vision grossly intact. Gaze aligned appropriately.        Right eye: No foreign body or discharge.        Left eye: No foreign body or discharge.     Intraocular pressure: Right eye pressure is 18 mmHg.     Extraocular Movements: Extraocular movements intact.     Conjunctiva/sclera:     Right eye: Right conjunctiva is injected.     Left eye: Left conjunctiva is not injected.   Cardiovascular:     Rate and Rhythm: Normal rate and regular rhythm.     Heart sounds: No murmur.  Pulmonary:     Effort: Pulmonary effort is normal. No respiratory distress.     Breath sounds: Normal breath sounds.  Abdominal:     Palpations: Abdomen is soft.     Tenderness: There is no abdominal tenderness.  Genitourinary:    Penis: Normal.      Testes:        Right: Mass or tenderness not present.        Left: Mass and tenderness present.  Musculoskeletal:     Cervical back: Neck supple.  Skin:    General: Skin is warm and dry.  Neurological:     Mental Status: He is alert.     ED Results / Procedures / Treatments   Labs (all labs ordered are listed, but only abnormal results are displayed) Labs Reviewed - No data to display  EKG None  Radiology US SCROTUM W/DOPPLER  Result Date: 03/20/2020 CLINICAL DATA:  39 year old presenting with a tender palpable lump involving the LEFT hemiscrotum. EXAM: SCROTAL ULTRASOUND DOPPLER ULTRASOUND OF THE TESTICLES TECHNIQUE: Complete ultrasound examination of the testicles, epididymis, and other scrotal structures was performed. Color and spectral Doppler ultrasound were also utilized to evaluate blood flow to the testicles. COMPARISON:  None. FINDINGS: Right testicle Measurements: Approximately 4.2 x 2.6 x 3.5 cm. Normal parenchymal echotexture without mass or microlithiasis. Normal color Doppler flow without evidence of hyperemia. Left testicle Measurements: Approximately 4.6 x 2.5  x 2.7 cm. Normal parenchymal echotexture without mass or microlithiasis. Normal color Doppler flow without evidence of hyperemia. Right epididymis: Normal in size and appearance without evidence of hyperemia. Left epididymis: Normal in size and appearance without evidence of hyperemia. Hydrocele: Small LEFT hydrocele containing a benign 6 mm freely mobile calcification (scrotal pearl ). No RIGHT hydrocele. Varicocele:  Absent bilaterally. Pulsed Doppler interrogation of both testes demonstrates normal low resistance arterial and venous waveforms bilaterally. IMPRESSION: 1. No significant abnormality. 2. Small LEFT hydrocele containing a benign 6 mm freely mobile calcification (scrotal pearl ). Electronically Signed   By: Evangeline Dakin M.D.   On: 03/20/2020 16:13  Procedures Procedures (including critical care time)  Medications Ordered in ED Medications  tetracaine (PONTOCAINE) 0.5 % ophthalmic solution 2 drop (has no administration in time range)  fluorescein ophthalmic strip 1 strip (has no administration in time range)    ED Course  I have reviewed the triage vital signs and the nursing notes.  Pertinent labs & imaging results that were available during my care of the patient were reviewed by me and considered in my medical decision making (see chart for details).  Clinical Course as of Mar 22 831  Wed Mar 20, 2020  1622 Discussed with Dr. Dione Booze ophthalmology.  He feels it may be a prepterygium.  He is offered to see the patient tomorrow.  Reviewed all this with the patient along with his ultrasound findings.  We will also refer him onto urology.  Return instructions discussed   [MB]    Clinical Course User Index [MB] Terrilee Files, MD   MDM Rules/Calculators/A&P                     Differential diagnosis includes corneal abrasion, glaucoma.  For testicular pain concern for testicular cancer, epididymitis, hydrocele, varicocele, torsion  Patient ultrasound reassuring.  Gave  him information for ophthalmology and urology follow-up.  Return instructions discussed.  Final Clinical Impression(s) / ED Diagnoses Final diagnoses:  Irritation of right eye  Hydrocele, unspecified hydrocele type    Rx / DC Orders ED Discharge Orders    None       Terrilee Files, MD 03/21/20 [phone]

## 2020-03-20 NOTE — ED Notes (Signed)
ED Provider at bedside. 

## 2020-03-20 NOTE — ED Triage Notes (Signed)
Pt c/o itching to right eye x 1 month-dx with allergies rx eye drop-states eye no better-pt also c/o "blood coming out of my left ear" x 2 days-pt also c/o "lump on my left testicle" x 6-7 months-NAD-steady gait

## 2020-03-20 NOTE — Discharge Instructions (Addendum)
You were seen in the emergency department for right eye irritation, bleeding from your right ear, and left testicular pain.  You will need to follow-up with Dr. Dione Booze ophthalmology tomorrow to reevaluate your eye.  Your ultrasound showed hydrocele and this can be followed up with urology.  Return to the emergency department for any worsening or concerning symptoms  Below is the impression from the radiologist regarding the scrotal ultrasound: IMPRESSION:  1. No significant abnormality.  2. Small LEFT hydrocele containing a benign 6 mm freely mobile  calcification (scrotal pearl ).

## 2020-03-20 NOTE — ED Notes (Signed)
Pt seen @ [place] facility and was told he had 'dirt in his R eye. Light sensitive causing a headache. Also c/o r ear drainage, had blood on pillow this past Saturday. 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

## 2020-03-20 NOTE — ED Notes (Signed)
Pt ambulated to US

## 2020-06-13 IMAGING — US US SCROTUM W/ DOPPLER COMPLETE
2 series · 13 of 25 positions shown · non-contrast
Comparison: None.

CLINICAL DATA: 38-year-old presenting with a tender palpable lump
involving the LEFT hemiscrotum.

EXAM:
SCROTAL ULTRASOUND
DOPPLER ULTRASOUND OF THE TESTICLES
TECHNIQUE: Complete ultrasound examination of the testicles, epididymis, and
other scrotal structures was performed. Color and spectral Doppler
ultrasound were also utilized to evaluate blood flow to the
testicles.

[Series 1: us scrotum w/ doppler complete · 38 acquisitions, 12 frames shown (1 of 2)]
[im 1/38]
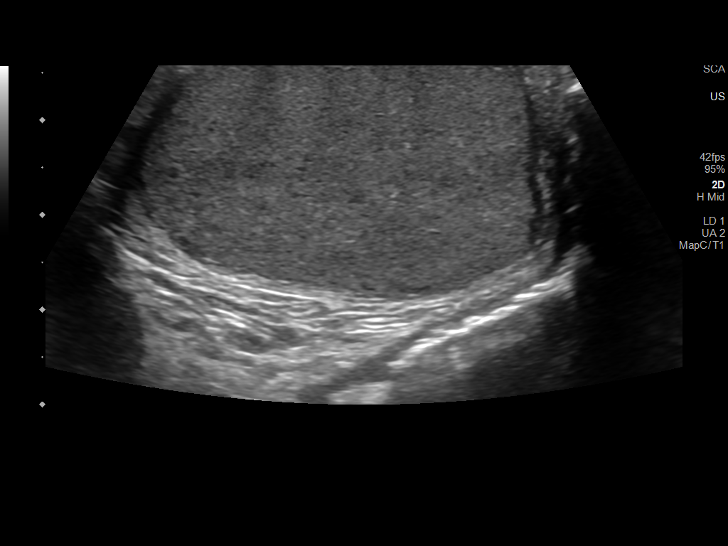
[im 4/38]
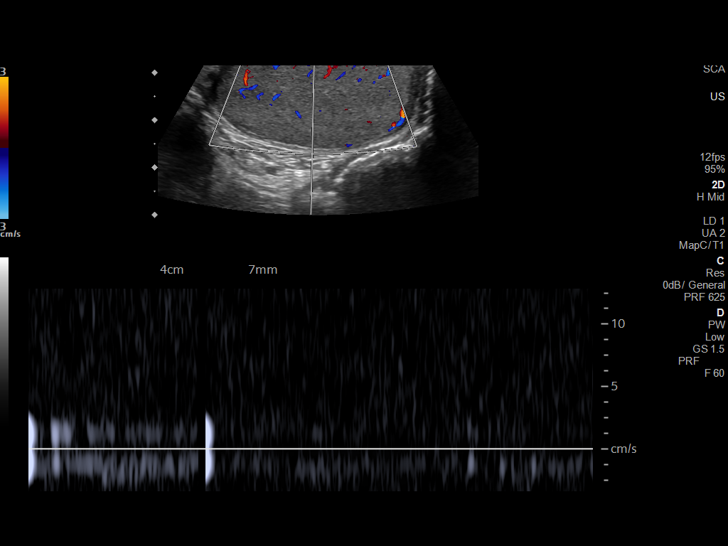
[im 7/38]
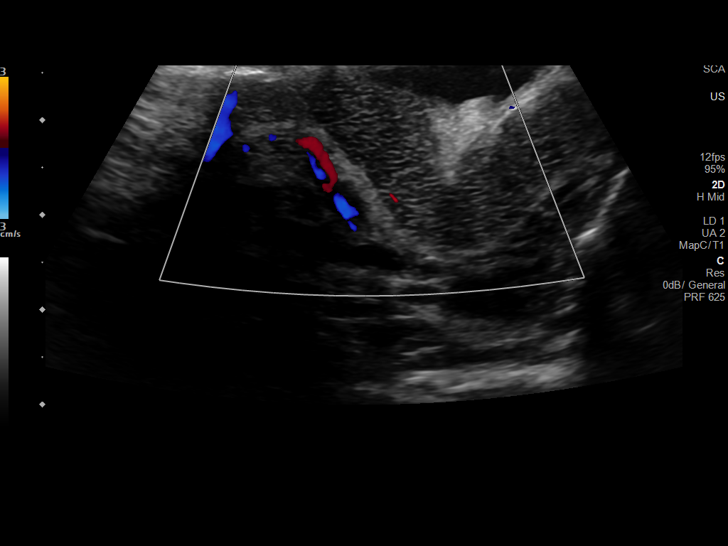
[im 10/38]
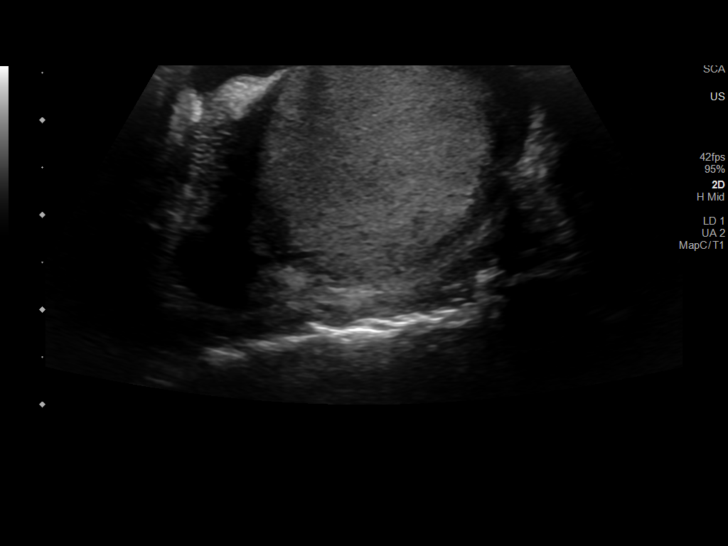
[im 13/38]
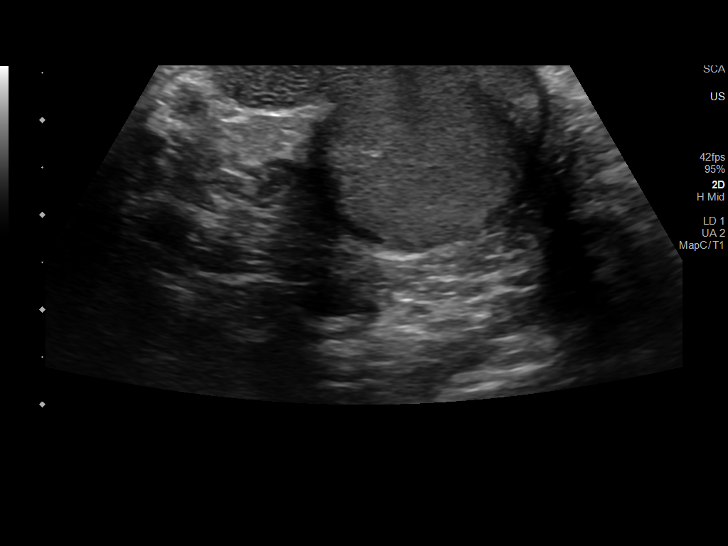
[im 17/38]
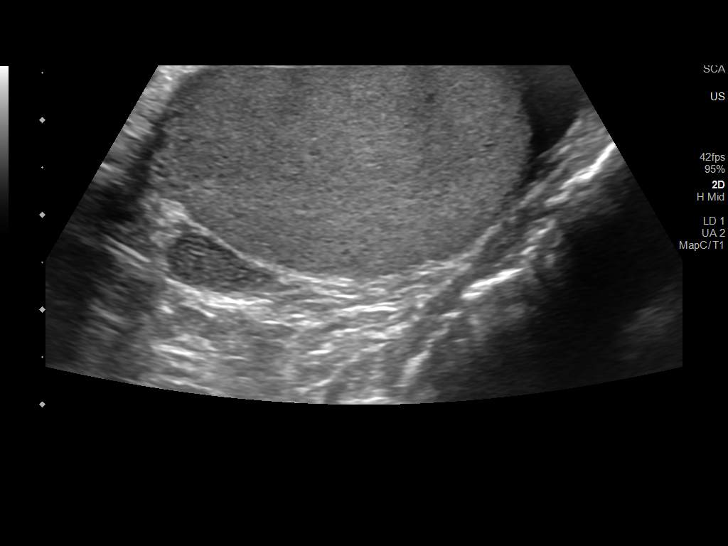
[im 20/38]
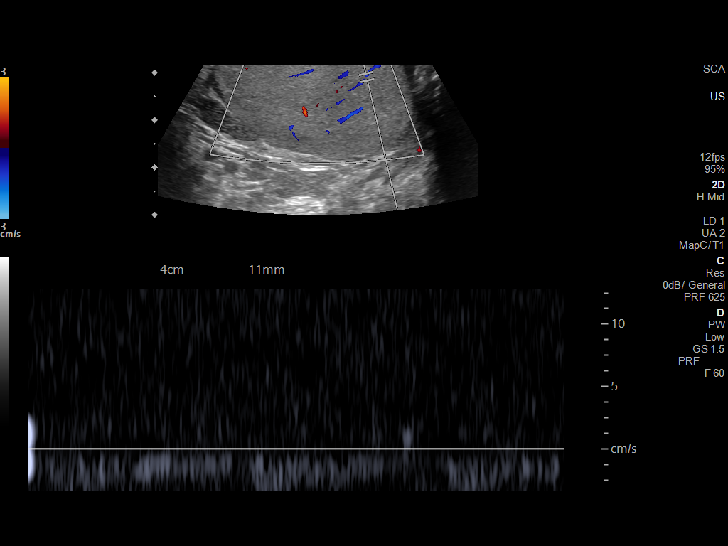
[im 23/38]
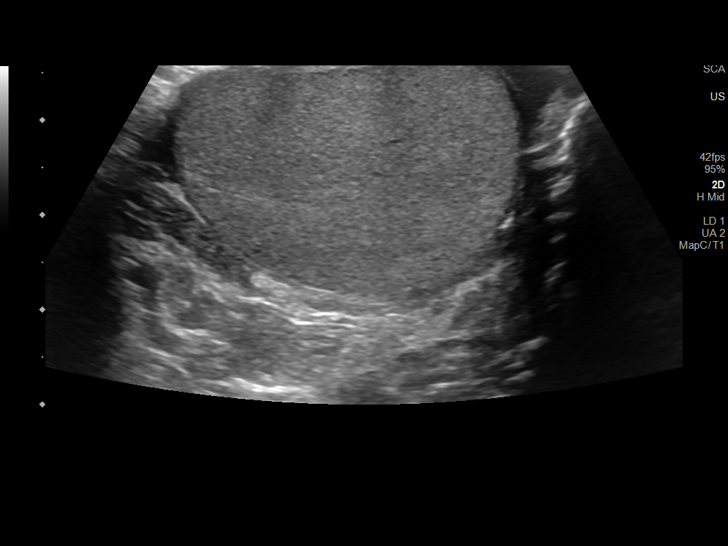
[im 26/38]
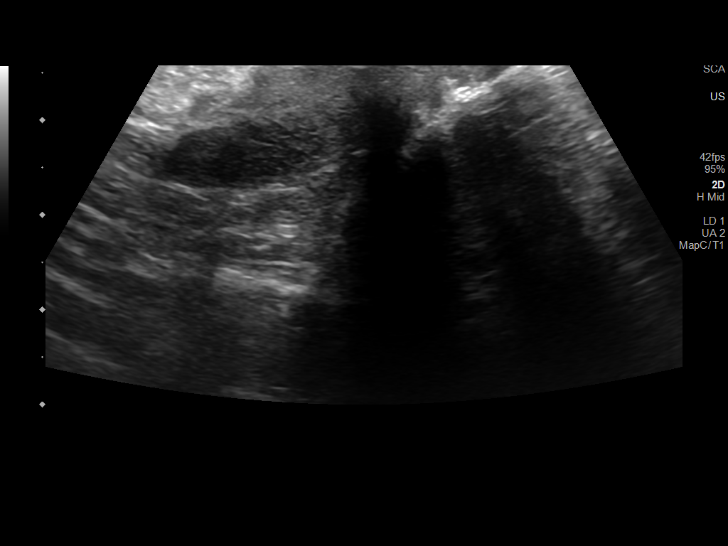
[im 29/38]
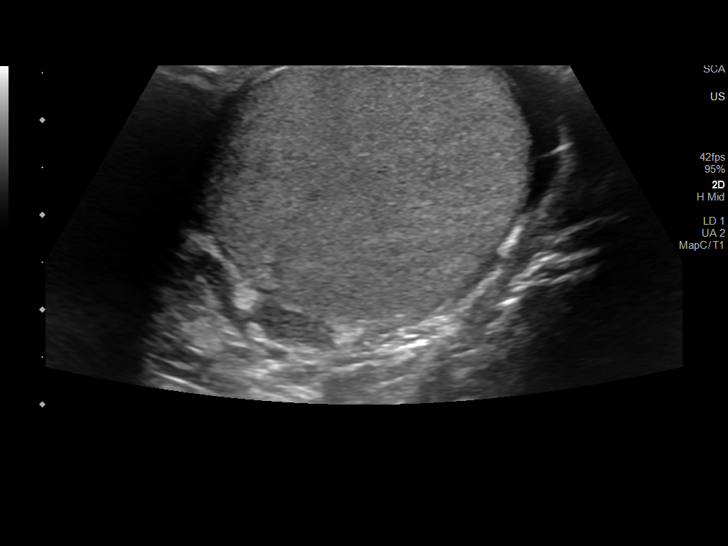
[im 33/38]
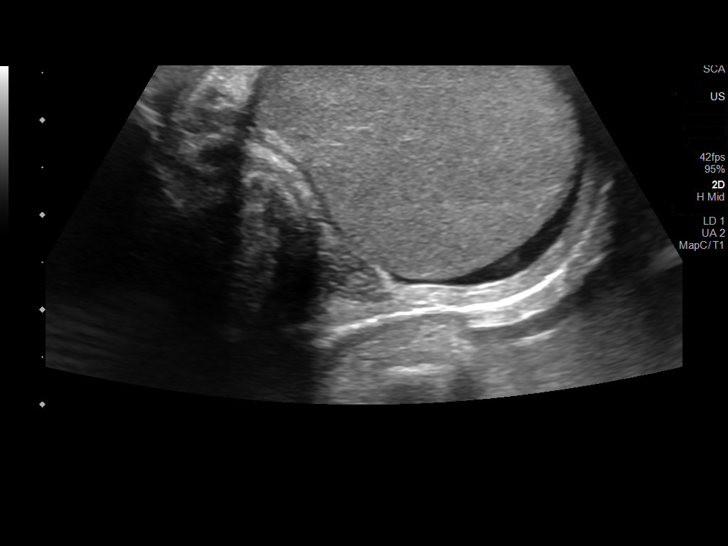
[im 36/38]
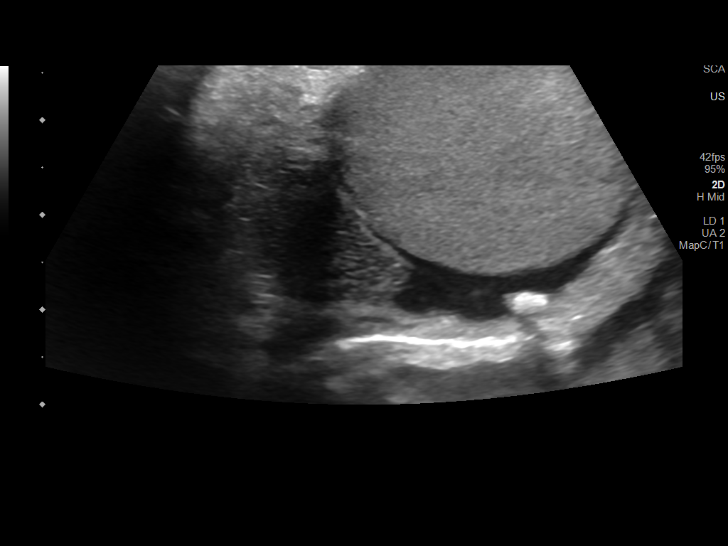

[Series 2: us scrotum w/ doppler complete · 1 of 2 slices shown (2 of 2)]
[im 1/2]
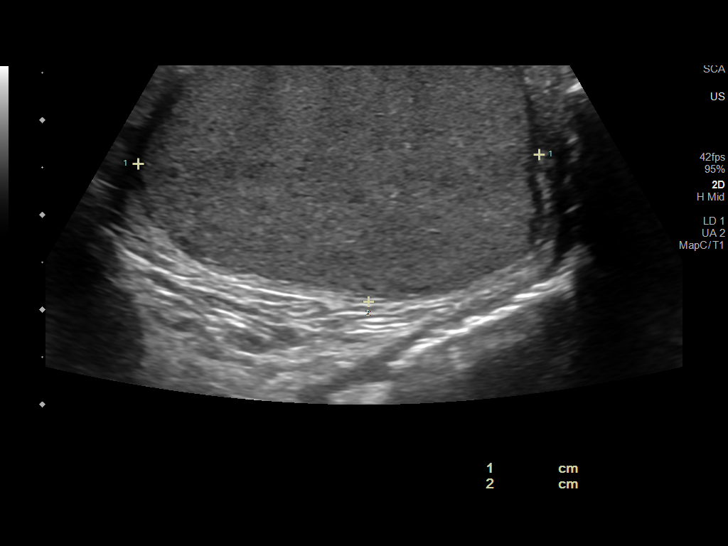

[13 of 25 positions shown; findings below may reference images not displayed]

FINDINGS: Right testicle

Measurements: Approximately 4.2 x 2.6 x 3.5 cm. Normal parenchymal
echotexture without mass or microlithiasis. Normal color Doppler
flow without evidence of hyperemia.

Left testicle

Measurements: Approximately 4.6 x 2.5 x 2.7 cm. Normal parenchymal
echotexture without mass or microlithiasis. Normal color Doppler
flow without evidence of hyperemia.

Right epididymis: Normal in size and appearance without evidence of
hyperemia.

Left epididymis: Normal in size and appearance without evidence of
hyperemia.

Hydrocele: Small LEFT hydrocele containing a benign 6 mm freely
mobile calcification (scrotal pearl ). No RIGHT hydrocele.

Varicocele:  Absent bilaterally.

Pulsed Doppler interrogation of both testes demonstrates normal low
resistance arterial and venous waveforms bilaterally.
IMPRESSION: 1. No significant abnormality.
2. Small LEFT hydrocele containing a benign 6 mm freely mobile
calcification (scrotal pearl ).

## 2020-09-06 ENCOUNTER — Ambulatory Visit: Payer: Commercial Managed Care - PPO | Admitting: Sports Medicine

## 2020-09-06 ENCOUNTER — Encounter: Payer: Self-pay | Admitting: Sports Medicine

## 2020-09-06 ENCOUNTER — Ambulatory Visit (INDEPENDENT_AMBULATORY_CARE_PROVIDER_SITE_OTHER): Payer: Commercial Managed Care - PPO

## 2020-09-06 ENCOUNTER — Other Ambulatory Visit: Payer: Self-pay

## 2020-09-06 DIAGNOSIS — M722 Plantar fascial fibromatosis: Secondary | ICD-10-CM

## 2020-09-06 DIAGNOSIS — M216X1 Other acquired deformities of right foot: Secondary | ICD-10-CM

## 2020-09-06 DIAGNOSIS — M7662 Achilles tendinitis, left leg: Secondary | ICD-10-CM | POA: Diagnosis not present

## 2020-09-06 DIAGNOSIS — M216X2 Other acquired deformities of left foot: Secondary | ICD-10-CM

## 2020-09-06 DIAGNOSIS — F332 Major depressive disorder, recurrent severe without psychotic features: Secondary | ICD-10-CM | POA: Insufficient documentation

## 2020-09-06 DIAGNOSIS — G5753 Tarsal tunnel syndrome, bilateral lower limbs: Secondary | ICD-10-CM

## 2020-09-06 DIAGNOSIS — M7661 Achilles tendinitis, right leg: Secondary | ICD-10-CM | POA: Diagnosis not present

## 2020-09-06 MED ORDER — MELOXICAM 15 MG PO TABS: 15.0000 mg | ORAL_TABLET | Freq: Every day | ORAL | 0 refills | Status: AC

## 2020-09-06 MED ORDER — PREDNISONE 10 MG (21) PO TBPK: ORAL_TABLET | ORAL | 0 refills | Status: AC

## 2020-09-06 MED ORDER — GABAPENTIN 300 MG PO CAPS: 300.0000 mg | ORAL_CAPSULE | Freq: Every day | ORAL | 3 refills | Status: AC

## 2020-09-06 NOTE — Progress Notes (Signed)
Subjective: Marcus Morris is a 39 y.o. male patient presents to office with complaint of moderate heel pain on the left and right heels reports that pain is severe sometimes he can even stretch his feet out in the evenings because the pain is so bad.  Patient reports that he has been dealing with pain for years tingling burning to the backs of the heels reports that his feet hurt all the time nothing has helped to make them better reports that 6 months ago he saw a doctor in [place] who did x-rays and said they had a bone spur and gave him a shot that did not help patient reports that pain has been getting worse with swelling and cold sensation to his feet.  Patient does admit to a previous history of heart problems and was hospitalized many years ago for this but now that issue has resolved.  Patient reports that sometimes his feet hurt so bad that he wants to cut his feet off.  Patient reports that he has had previous nerve and vascular studies which have been normal.  Patient is assisted by wife who helps to report this history.  Review of systems noncontributory  Patient Active Problem List   Diagnosis Date Noted  . Severe episode of recurrent major depressive disorder, without psychotic features (HCC) 09/06/2020  . Encounter for loop recorder at end of battery life 08/25/2019  . History of syncope 08/25/2019  . Overweight 08/25/2019  . Status post placement of implantable loop recorder 08/25/2019  . Smoker 08/25/2019  . Contusion of chest wall 02/05/2016  . Partial seizure with impaired consciousness (HCC) 02/05/2016  . Sinus tachycardia 05/08/2015  . Syncope 05/08/2015  . Numbness and tingling of both legs 04/12/2015  . Atypical chest pain 04/11/2015  . Chronic pain 04/11/2015  . Essential hypertension 04/11/2015  . Suicidal ideation 04/11/2015  . Suicide attempt (HCC) 04/11/2015  . Mood disorder of depressed type 04/10/2015  . Cuboid fracture 09/08/2012  . Fracture of metacarpal base  of right hand, closed 08/23/2012  . Hand pain 08/23/2012  . Ankle pain 08/23/2012    No current outpatient medications on file prior to visit.   No current facility-administered medications on file prior to visit.    Allergies  Allergen Reactions  . Contrast Media [Iodinated Diagnostic Agents] Hives    Benadryl given    . Asa [Aspirin] Swelling  . Aspirin   . Shellfish Allergy Hives    Objective: Physical Exam General: The patient is alert and oriented x3 in no acute distress.  Dermatology: Skin is warm, dry and supple bilateral lower extremities. Nails 1-10 are normal. There is no erythema, edema, no eccymosis, no open lesions present. Integument is otherwise unremarkable.  Vascular: Dorsalis Pedis pulse and Posterior Tibial pulse are 2/4 bilateral. Capillary fill time is immediate to all digits.  Neurological: Grossly intact to light touch bilateral.  Positive Valleuix sign bilateral.  Musculoskeletal: Tenderness to palpation at the medial calcaneal tubercale and through the insertion of the plantar fascia on the left and right foot.  There is also tenderness to palpation to the Achilles insertion however the plantar heel pain is greater.  No pain with compression of calcaneus bilateral. No pain with calf compression bilateral. There is decreased Ankle joint range of motion bilateral. All other joints range of motion within normal limits bilateral.  Pes cavus foot type bilateral.  Strength 5/5 in all groups bilateral.   Gait: Unassisted, Antalgic avoid weight on heels  Xray, Right/Left foot:  Normal osseous mineralization. Joint spaces preserved however there is elevation of the calcaneus supportive of pain is cavus deformity. No fracture/dislocation/boney destruction.  Posterior calcaneal spur present with mild thickening of plantar fascia. No other soft tissue abnormalities or radiopaque foreign bodies.   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  -Xrays reviewed -Discussed with patient in detail the condition of plantar fasciitis with likely tendinitis and neuritis possible tarsal tunnel, how this occurs and general treatment options. Explained both conservative and surgical treatments.  -No injection given at this time since previous injections have not been effective -Rx Meloxicam to start after prednisone dose pack is completed -Recommended good supportive shoes - Explained in detail the use of the fascial braces bilateral and night splints bilateral which was dispensed at today's visit. -Explained and dispensed to patient daily stretching exercises. -Recommend patient to ice affected area 1-2x daily. -Patient to return to office in 2-3 weeks for follow up or sooner if problems or questions arise.  Asencion Islam, DPM

## 2020-09-20 ENCOUNTER — Other Ambulatory Visit: Payer: Self-pay

## 2020-09-20 ENCOUNTER — Encounter: Payer: Self-pay | Admitting: Sports Medicine

## 2020-09-20 ENCOUNTER — Ambulatory Visit: Payer: Commercial Managed Care - PPO | Admitting: Sports Medicine

## 2020-09-20 DIAGNOSIS — M2550 Pain in unspecified joint: Secondary | ICD-10-CM

## 2020-09-20 DIAGNOSIS — M79672 Pain in left foot: Secondary | ICD-10-CM

## 2020-09-20 DIAGNOSIS — M722 Plantar fascial fibromatosis: Secondary | ICD-10-CM

## 2020-09-20 DIAGNOSIS — G5753 Tarsal tunnel syndrome, bilateral lower limbs: Secondary | ICD-10-CM

## 2020-09-20 DIAGNOSIS — M79671 Pain in right foot: Secondary | ICD-10-CM

## 2020-09-20 DIAGNOSIS — M7661 Achilles tendinitis, right leg: Secondary | ICD-10-CM

## 2020-09-20 DIAGNOSIS — M216X2 Other acquired deformities of left foot: Secondary | ICD-10-CM

## 2020-09-20 DIAGNOSIS — M25572 Pain in left ankle and joints of left foot: Secondary | ICD-10-CM | POA: Diagnosis not present

## 2020-09-20 DIAGNOSIS — M25571 Pain in right ankle and joints of right foot: Secondary | ICD-10-CM

## 2020-09-20 DIAGNOSIS — M7662 Achilles tendinitis, left leg: Secondary | ICD-10-CM

## 2020-09-20 DIAGNOSIS — M216X1 Other acquired deformities of right foot: Secondary | ICD-10-CM

## 2020-09-20 NOTE — Progress Notes (Signed)
Subjective: Marcus Morris is a 39 y.o. male patient returns to office for follow-up evaluation of bilateral foot pain. Reports that pain is the same heels hurt and medication did not help and night splints help some. Patient is assisted by wife who helps to report this history.  Patient Active Problem List   Diagnosis Date Noted  . Severe episode of recurrent major depressive disorder, without psychotic features ([place]) 09/06/2020  . Encounter for loop recorder at end of battery life 08/25/2019  . History of syncope 08/25/2019  . Overweight 08/25/2019  . Status post placement of implantable loop recorder 08/25/2019  . Smoker 08/25/2019  . Contusion of chest wall 02/05/2016  . Partial seizure with impaired consciousness ([place]) 02/05/2016  . Sinus tachycardia 05/08/2015  . Syncope 05/08/2015  . Numbness and tingling of both legs 04/12/2015  . Atypical chest pain 04/11/2015  . Chronic pain 04/11/2015  . Essential hypertension 04/11/2015  . Suicidal ideation 04/11/2015  . Suicide attempt ([place]) 04/11/2015  . Mood disorder of depressed type 04/10/2015  . Cuboid fracture 09/08/2012  . Fracture of metacarpal base of right hand, closed 08/23/2012  . Hand pain 08/23/2012  . Ankle pain 08/23/2012    Current Outpatient Medications on File Prior to Visit  Medication Sig Dispense Refill  . gabapentin (NEURONTIN) 300 MG capsule Take 1 capsule (300 mg total) by mouth at bedtime. 90 capsule 3  . meloxicam (MOBIC) 15 MG tablet Take 1 tablet (15 mg total) by mouth daily. In the morning or when you wake 30 tablet 0  . predniSONE (DELTASONE) 10 MG tablet Take by mouth.    . predniSONE (STERAPRED UNI-PAK 21 TAB) 10 MG (21) TBPK tablet Take as directed in the morning/when wake 21 tablet 0   No current facility-administered medications on file prior to visit.    Allergies  Allergen Reactions  . Contrast Media [Iodinated Diagnostic Agents] Hives    Benadryl given    . Asa [Aspirin] Swelling  . Aspirin    . Shellfish Allergy Hives    Objective: Physical Exam General: The patient is alert and oriented x3 in no acute distress.  Dermatology: Skin is warm, dry and supple bilateral lower extremities. Nails 1-10 are normal. There is no erythema, edema, no eccymosis, no open lesions present. Integument is otherwise unremarkable.  Vascular: Dorsalis Pedis pulse and Posterior Tibial pulse are 2/4 bilateral. Capillary fill time is immediate to all digits.  Neurological: Grossly intact to light touch bilateral.  Positive Valleuix sign bilateral.  Musculoskeletal: Tenderness to palpation at the medial calcaneal tubercale and through the insertion of the plantar fascia on the left and right foot.  There is also tenderness to palpation to the Achilles insertion however the plantar heel pain is greater like before.  No pain with compression of calcaneus bilateral. No pain with calf compression bilateral. There is decreased Ankle joint range of motion bilateral. All other joints range of motion within normal limits bilateral.  Pes cavus foot type bilateral.  Strength 5/5 in all groups bilateral.    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  -Previous Xrays reviewed -Re-Discussed with patient in detail the condition of plantar fasciitis with likely tendinitis and neuritis possible tarsal tunnel, how this occurs and general treatment  options. Explained both conservative and surgical treatments.  -After oral consent and aseptic prep, injected a mixture containing 1 ml of 2%  plain lidocaine, 1 ml 0.5% plain marcaine, 0.5 ml of kenalog 10 and 0.5 ml of dexamethasone phosphate into right and left heel without complication. Post-injection care discussed with patient.  -Rx Arthritic panel  -Recommended good supportive shoes daily for foot type - Explained in detail the use of the fascial braces bilateral which were dispensed again this visit  -Continue with night splints bilateral  -Explained and dispensed to patient daily stretching exercises. -Recommend patient to ice affected area 1-2x daily. -Patient to return to office after Arthritic panel or sooner if problems or questions arise.  Landis Martins, DPM

## 2020-10-03 LAB — URIC ACID: Uric Acid: 7.8 mg/dL (ref 3.8–8.4)

## 2020-10-03 LAB — SEDIMENTATION RATE: Sed Rate: 10 mm/hr (ref 0–15)

## 2020-10-03 LAB — CBC WITH DIFFERENTIAL/PLATELET
Basophils Absolute: 0.1 10*3/uL (ref 0.0–0.2)
Basos: 1 %
EOS (ABSOLUTE): 0.1 10*3/uL (ref 0.0–0.4)
Eos: 1 %
Hematocrit: 45 % (ref 37.5–51.0)
Hemoglobin: 16 g/dL (ref 13.0–17.7)
Immature Grans (Abs): 0 10*3/uL (ref 0.0–0.1)
Immature Granulocytes: 0 %
Lymphocytes Absolute: 2.8 10*3/uL (ref 0.7–3.1)
Lymphs: 29 %
MCH: 30.5 pg (ref 26.6–33.0)
MCHC: 35.6 g/dL (ref 31.5–35.7)
MCV: 86 fL (ref 79–97)
Monocytes Absolute: 0.8 10*3/uL (ref 0.1–0.9)
Monocytes: 8 %
Neutrophils Absolute: 6 10*3/uL (ref 1.4–7.0)
Neutrophils: 61 %
Platelets: 323 10*3/uL (ref 150–450)
RBC: 5.25 x10E6/uL (ref 4.14–5.80)
RDW: 12.3 % (ref 11.6–15.4)
WBC: 9.7 10*3/uL (ref 3.4–10.8)

## 2020-10-03 LAB — C-REACTIVE PROTEIN: CRP: 2 mg/L (ref 0–10)

## 2020-10-03 LAB — RHEUMATOID FACTOR: Rheumatoid fact SerPl-aCnc: <10 IU/mL (ref <14.0)

## 2020-10-03 LAB — HLA-B27 ANTIGEN: HLA B27: NEGATIVE

## 2020-10-03 LAB — ANA: Anti Nuclear Antibody (ANA): NEGATIVE

## 2020-10-09 ENCOUNTER — Telehealth: Payer: Self-pay | Admitting: *Deleted

## 2020-10-09 NOTE — Telephone Encounter (Signed)
Called and spoke with the patient and relayed the message per Dr Stover. Ashle Stief °

## 2022-08-21 ENCOUNTER — Encounter: Payer: Self-pay | Admitting: Podiatrist

## 2022-08-21 ENCOUNTER — Ambulatory Visit (INDEPENDENT_AMBULATORY_CARE_PROVIDER_SITE_OTHER): Payer: BC Managed Care – PPO

## 2022-08-21 ENCOUNTER — Ambulatory Visit (INDEPENDENT_AMBULATORY_CARE_PROVIDER_SITE_OTHER): Payer: BC Managed Care – PPO | Admitting: Podiatrist

## 2022-08-21 DIAGNOSIS — M79671 Pain in right foot: Secondary | ICD-10-CM | POA: Diagnosis not present

## 2022-08-21 DIAGNOSIS — R252 Cramp and spasm: Secondary | ICD-10-CM | POA: Diagnosis not present

## 2022-08-21 DIAGNOSIS — S86811D Strain of other muscle(s) and tendon(s) at lower leg level, right leg, subsequent encounter: Secondary | ICD-10-CM | POA: Diagnosis not present

## 2022-08-21 DIAGNOSIS — M722 Plantar fascial fibromatosis: Secondary | ICD-10-CM | POA: Diagnosis not present

## 2022-08-21 MED ORDER — CYCLOBENZAPRINE HCL 5 MG PO TABS: 5.0000 mg | ORAL_TABLET | Freq: Every day | ORAL | 2 refills | Status: AC

## 2022-08-21 MED ORDER — GABAPENTIN 400 MG PO CAPS: 400.0000 mg | ORAL_CAPSULE | Freq: Three times a day (TID) | ORAL | 2 refills | Status: AC

## 2022-08-21 NOTE — Progress Notes (Unsigned)
Chief Complaint  Patient presents with   Foot Pain    Right foot pain- on going for years      HPI: Patient is 41 y.o. male who presents today for foot pain especially in his right foot and calf. He relates he was seen in the ER where a scan of his calf was performed and showed no blood clot.  He relates his calf feels like it has a constant cramp and wearing an ace wrap over it is minimally helpful.  He also has pain in the left arch and heel as well.  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    Allergies  Allergen Reactions   Contrast Media [Iodinated Contrast Media] Hives    Benadryl given     Asa [Aspirin] Swelling   Aspirin    Shellfish Allergy Hives    Review of systems is negative except as noted in the HPI.  Denies nausea/ vomiting/ fevers/ chills or night sweats.   Denies difficulty breathing, denies calf pain or tenderness  Physical Exam  Patient is awake, alert, and oriented x 3.  In no acute distress.    Vascular status is intact with palpable pedal pulses DP and PT bilateral and capillary refill time less than 3 seconds bilateral.  No edema or erythema noted.   Neurological exam reveals epicritic and protective sensation grossly intact bilateral.   Dermatological exam reveals skin is supple and dry to bilateral feet.  No open lesions present.    Musculoskeletal exam: decreased range of motion at the ankle is noted.  Pain with calf compression noted right. Calf is supple. No palpable cord noted.  Bilateral feet have pain on palplation at insertion of the plantar fascia on the calcaneus.    Xray:  see read: CLINICAL DATA:  Right foot pain for 3 weeks, no reported injury   EXAM: RIGHT FOOT COMPLETE - 3+ VIEW   COMPARISON:  09/06/2020   FINDINGS: There is no evidence of fracture or dislocation. Mild first metatarsophalangeal arthrosis. Otherwise  preserved joint spaces. Small Achilles calcaneal spur. Normal alignment of the midfoot on standing lateral view. Soft tissues are unremarkable.   IMPRESSION: 1. No fracture or dislocation of the right foot. 2. Mild first metatarsophalangeal arthrosis with otherwise preserved joint spaces. 3. Small Achilles calcaneal spur.     Electronically Signed   By: Delanna Ahmadi M.D.   On: 08/23/2022 16:33     Assessment:   ICD-10-CM   1. Right foot pain  M79.671 DG Foot Complete Right    2. Plantar fasciitis, bilateral  M72.2     3. Muscle cramps  R25.2     4. Strain of calf muscle, right, subsequent encounter  S86.811D         Plan:  I am starting him back on Gabapentin for pain as well as Flexeril for muscle cramps.  Discussed that he should use these medications when he has a day or two off to make sure he is not too drowsy when he has some days off.    Also a plantar fascial injection was recommended.  The patient agreed and a sterile skin prep was applied.  An injection consisting of 40 mg kenalog and marcaine mixture was infiltrated at the point of maximal tenderness on the bilateral Heels.  The patient tolerated this well and was given instructions  for aftercare.   Plantar fascia braces were dispensed again as he has misplaced his last pair.  We will see him back in 4-6 weeks and he will call sooner if questions arise.

## 2022-08-21 NOTE — Patient Instructions (Signed)

## 2022-10-02 ENCOUNTER — Ambulatory Visit: Payer: BC Managed Care – PPO | Admitting: Podiatrist
